# Patient Record
Sex: Female | Born: 1995 | Race: Black or African American | Hispanic: No | Marital: Single | Smoking: Never smoker
Health system: Southern US, Community
[De-identification: ages and names within clinical notes are randomized; demographics above are authoritative.]

## PROBLEM LIST (undated history)

## (undated) ENCOUNTER — Inpatient Hospital Stay (HOSPITAL_COMMUNITY): Payer: Self-pay

## (undated) DIAGNOSIS — Z789 Other specified health status: Secondary | ICD-10-CM

## (undated) HISTORY — PX: WISDOM TOOTH EXTRACTION: SHX21

---

## 2016-06-21 ENCOUNTER — Ambulatory Visit (HOSPITAL_COMMUNITY)
Admission: EM | Admit: 2016-06-21 | Discharge: 2016-06-21 | Disposition: A | Discharge status: Home / Self Care | Payer: Self-pay | Attending: Family Medicine | Admitting: Family Medicine

## 2016-06-21 ENCOUNTER — Encounter (HOSPITAL_COMMUNITY): Payer: Self-pay | Admitting: Family Medicine

## 2016-06-21 DIAGNOSIS — N762 Acute vulvitis: Secondary | ICD-10-CM | POA: Insufficient documentation

## 2016-06-21 LAB — POCT PREGNANCY, URINE: PREG TEST UR: NEGATIVE

## 2016-06-21 MED ORDER — FLUCONAZOLE 150 MG PO TABS: 150.0000 mg | ORAL_TABLET | Freq: Once | ORAL | 1 refills | Status: AC

## 2016-06-21 NOTE — ED Provider Notes (Signed)
[facility]    CSN: [id number] Arrival date & time: 06/21/16  1811     History   Chief Complaint Chief Complaint  Patient presents with  . Vaginitis    HPI Brittany Berg is a 21 y.o. female.   Pt here for vaginal swelling, redness and slight itching since Monday. She's had yeast infections before but they've all been associated with discharge.  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 No pertinent past medical history.  There are no active problems to display for this patient.   History reviewed. No pertinent surgical history.  OB History    No data available       Home Medications    Prior to Admission medications   Medication Sig Start Date End Date Taking? Authorizing Provider  fluconazole (DIFLUCAN) 150 MG tablet Take 1 tablet (150 mg total) by mouth once. Repeat if needed 06/21/16 06/21/16  Elvina SidleLauenstein, Cedar Ditullio, MD    Family History History reviewed. No pertinent family history.  Social History Social History  Substance Use Topics  . Smoking status: Never Smoker  . Smokeless tobacco: Never Used  . Alcohol use Not on file     Allergies   Patient has no known allergies.   Review of Systems Review of Systems  Genitourinary: Positive for vaginal pain.  All other systems reviewed and are negative.    Physical Exam Triage Vital Signs ED Triage Vitals [06/21/16 1852]  Enc Vitals Group     BP 99/61     Pulse Rate 81     Resp 18     Temp 98.4 F (36.9 C)     Temp Source Oral     SpO2 100 %     Weight      Height      Head Circumference      Peak Flow      Pain Score      Pain Loc      Pain Edu?      Excl. in GC?    No data found.   Updated Vital Signs BP 99/61   Pulse 81   Temp 98.4 F (36.9 C) (Oral)   Resp 18   LMP 05/10/2016   SpO2 100%    Physical Exam  Constitutional: She is oriented to person, place, and time. She appears well-developed and well-nourished.  HENT:  Right Ear:  External ear normal.  Left Ear: External ear normal.  Mouth/Throat: Oropharynx is clear and moist.  Eyes: Conjunctivae are normal. Pupils are equal, round, and reactive to light.  Neck: Normal range of motion. Neck supple.  Musculoskeletal: Normal range of motion.  Neurological: She is alert and oriented to person, place, and time.  Skin: Skin is dry.  Nursing note and vitals reviewed.    UC Treatments / Results  Labs (all labs ordered are listed, but only abnormal results are displayed) Labs Reviewed  POCT PREGNANCY, URINE  URINE CYTOLOGY ANCILLARY ONLY    EKG  EKG Interpretation None       Radiology No results found.  Procedures Procedures (including critical care time)  Medications Ordered in UC Medications - No data to display   Initial Impression / Assessment and Plan / UC Course  I have reviewed the triage vital signs and the nursing notes.  Pertinent labs & imaging results that were available during my care of the patient were reviewed by me and considered in my medical decision making (see chart  for details).     Final Clinical Impressions(s) / UC Diagnoses   Final diagnoses:  Acute vulvitis    New Prescriptions New Prescriptions   FLUCONAZOLE (DIFLUCAN) 150 MG TABLET    Take 1 tablet (150 mg total) by mouth once. Repeat if needed     Elvina Sidle, MD 06/21/16 [phone]

## 2016-06-21 NOTE — ED Triage Notes (Signed)
Pt here for vaginal swelling, redness and slight itching since Monday.

## 2016-06-22 LAB — POCT URINALYSIS DIP (DEVICE)
BILIRUBIN URINE: NEGATIVE
Glucose, UA: NEGATIVE mg/dL
Ketones, ur: NEGATIVE mg/dL
NITRITE: NEGATIVE
PH: 6 (ref 5.0–8.0)
Protein, ur: NEGATIVE mg/dL
UROBILINOGEN UA: 1 mg/dL (ref 0.0–1.0)

## 2016-06-22 LAB — URINE CYTOLOGY ANCILLARY ONLY
Chlamydia: NEGATIVE
Neisseria Gonorrhea: NEGATIVE
Trichomonas: NEGATIVE

## 2016-06-26 LAB — URINE CYTOLOGY ANCILLARY ONLY
Bacterial vaginitis: NEGATIVE
Candida vaginitis: POSITIVE — AB

## 2016-11-10 ENCOUNTER — Inpatient Hospital Stay (HOSPITAL_COMMUNITY)
Admission: AD | Admit: 2016-11-10 | Discharge: 2016-11-10 | Disposition: A | Discharge status: Home / Self Care | Payer: Medicaid Other | Source: Ambulatory Visit | Attending: Obstetrics & Gynecology | Admitting: Obstetrics & Gynecology

## 2016-11-10 ENCOUNTER — Inpatient Hospital Stay (HOSPITAL_COMMUNITY): Payer: Medicaid Other

## 2016-11-10 ENCOUNTER — Encounter (HOSPITAL_COMMUNITY): Payer: Self-pay | Admitting: *Deleted

## 2016-11-10 DIAGNOSIS — O209 Hemorrhage in early pregnancy, unspecified: Secondary | ICD-10-CM | POA: Insufficient documentation

## 2016-11-10 DIAGNOSIS — O26851 Spotting complicating pregnancy, first trimester: Secondary | ICD-10-CM | POA: Diagnosis present

## 2016-11-10 DIAGNOSIS — O210 Mild hyperemesis gravidarum: Secondary | ICD-10-CM | POA: Insufficient documentation

## 2016-11-10 DIAGNOSIS — Z3A01 Less than 8 weeks gestation of pregnancy: Secondary | ICD-10-CM | POA: Diagnosis not present

## 2016-11-10 DIAGNOSIS — Z3491 Encounter for supervision of normal pregnancy, unspecified, first trimester: Secondary | ICD-10-CM

## 2016-11-10 DIAGNOSIS — O219 Vomiting of pregnancy, unspecified: Secondary | ICD-10-CM

## 2016-11-10 HISTORY — DX: Other specified health status: Z78.9

## 2016-11-10 LAB — URINALYSIS, ROUTINE W REFLEX MICROSCOPIC
BILIRUBIN URINE: NEGATIVE
Glucose, UA: NEGATIVE mg/dL
KETONES UR: NEGATIVE mg/dL
Nitrite: NEGATIVE
PH: 6 (ref 5.0–8.0)
Protein, ur: NEGATIVE mg/dL
Specific Gravity, Urine: 1.028 (ref 1.005–1.030)

## 2016-11-10 LAB — CBC
HCT: 34.2 % — ABNORMAL LOW (ref 36.0–46.0)
Hemoglobin: 11.5 g/dL — ABNORMAL LOW (ref 12.0–15.0)
MCH: 28.9 pg (ref 26.0–34.0)
MCHC: 33.6 g/dL (ref 30.0–36.0)
MCV: 85.9 fL (ref 78.0–100.0)
PLATELETS: 225 10*3/uL (ref 150–400)
RBC: 3.98 MIL/uL (ref 3.87–5.11)
RDW: 14.1 % (ref 11.5–15.5)
WBC: 4.9 10*3/uL (ref 4.0–10.5)

## 2016-11-10 LAB — WET PREP, GENITAL
Clue Cells Wet Prep HPF POC: NONE SEEN
Sperm: NONE SEEN
TRICH WET PREP: NONE SEEN
Yeast Wet Prep HPF POC: NONE SEEN

## 2016-11-10 LAB — HCG, QUANTITATIVE, PREGNANCY: hCG, Beta Chain, Quant, S: 22600 m[IU]/mL — ABNORMAL HIGH (ref <5)

## 2016-11-10 LAB — ABO/RH: ABO/RH(D): O POS

## 2016-11-10 LAB — POCT PREGNANCY, URINE: Preg Test, Ur: POSITIVE — AB

## 2016-11-10 MED ORDER — METOCLOPRAMIDE HCL 10 MG PO TABS: 10.0000 mg | ORAL_TABLET | Freq: Three times a day (TID) | ORAL | 2 refills | Status: DC

## 2016-11-10 MED ORDER — METOCLOPRAMIDE HCL 10 MG PO TABS: 10.0000 mg | ORAL_TABLET | Freq: Once | ORAL | Status: DC

## 2016-11-10 MED ORDER — PROMETHAZINE HCL 25 MG PO TABS: ORAL_TABLET | ORAL | 2 refills | Status: DC

## 2016-11-10 NOTE — MAU Provider Note (Signed)
Chief Complaint: Vaginal Bleeding; Abdominal Pain; and Possible Pregnancy   None     SUBJECTIVE HPI: Brittany Berg is a 21 y.o. G1P0 at [redacted]w[redacted]d by LMP who presents to maternity admissions reporting spotting/light vaginal bleeding x 4 days. Patient's last menstrual period was 09/29/2016.  She is sure of her LMP dates.  She also reports nausea with vomiting 2-3 times daily x 1 week. She has not tried any treatments. There are no other associated symptoms.   She denies vaginal itching/burning, urinary symptoms, h/a, dizziness, or fever/chills.     HPI  Past Medical History:  Diagnosis Date  . Medical history non-contributory    Past Surgical History:  Procedure Laterality Date  . WISDOM TOOTH EXTRACTION     Social History   Social History  . Marital status: Single    Spouse name: N/A  . Number of children: N/A  . Years of education: N/A   Occupational History  . Not on file.   Social History Main Topics  . Smoking status: Never Smoker  . Smokeless tobacco: Never Used  . Alcohol use No  . Drug use: No  . Sexual activity: Yes   Other Topics Concern  . Not on file   Social History Narrative  . No narrative on file   No current facility-administered medications on file prior to encounter.    No current outpatient prescriptions on file prior to encounter.   No Known Allergies  ROS:  Review of Systems  Constitutional: Negative for chills, fatigue and fever.  Respiratory: Negative for shortness of breath.   Cardiovascular: Negative for chest pain.  Gastrointestinal: Positive for nausea and vomiting.  Genitourinary: Positive for vaginal bleeding. Negative for difficulty urinating, dysuria, flank pain, pelvic pain, vaginal discharge and vaginal pain.  Neurological: Negative for dizziness and headaches.  Psychiatric/Behavioral: Negative.      I have reviewed patient's Past Medical Hx, Surgical Hx, Family Hx, Social Hx, medications and allergies.   Physical Exam   Patient Vitals for the past 24 hrs:  BP Temp Temp src Pulse Resp Height Weight  11/10/16 1504 (!) 109/54 98.6 F (37 C) Oral 94 18 5' 6.93" (1.7 m) 148 lb 8 oz (67.4 kg)   Constitutional: Well-developed, well-nourished female in no acute distress.  Cardiovascular: normal rate Respiratory: normal effort GI: Abd soft, non-tender. Pos BS x 4 MS: Extremities nontender, no edema, normal ROM Neurologic: Alert and oriented x 4.  GU: Neg CVAT.  PELVIC EXAM: Cervix pink, visually closed, without lesion, scant brown discharge, vaginal walls and external genitalia normal Bimanual exam: Cervix 0/long/high, firm, anterior, neg CMT, uterus nontender, nonenlarged, adnexa without tenderness, enlargement, or mass   LAB RESULTS Results for orders placed or performed during the hospital encounter of 11/10/16 (from the past 24 hour(s))  Urinalysis, Routine w reflex microscopic     Status: Abnormal   Collection Time: 11/10/16  3:08 PM  Result Value Ref Range   Color, Urine YELLOW YELLOW   APPearance HAZY (A) CLEAR   Specific Gravity, Urine 1.028 1.005 - 1.030   pH 6.0 5.0 - 8.0   Glucose, UA NEGATIVE NEGATIVE mg/dL   Hgb urine dipstick LARGE (A) NEGATIVE   Bilirubin Urine NEGATIVE NEGATIVE   Ketones, ur NEGATIVE NEGATIVE mg/dL   Protein, ur NEGATIVE NEGATIVE mg/dL   Nitrite NEGATIVE NEGATIVE   Leukocytes, UA TRACE (A) NEGATIVE   RBC / HPF 0-5 0 - 5 RBC/hpf   WBC, UA 0-5 0 - 5 WBC/hpf   Bacteria, UA FEW (  A) NONE SEEN   Squamous Epithelial / LPF 6-30 (A) NONE SEEN   Mucus PRESENT   Pregnancy, urine POC     Status: Abnormal   Collection Time: 11/10/16  3:19 PM  Result Value Ref Range   Preg Test, Ur POSITIVE (A) NEGATIVE  CBC     Status: Abnormal   Collection Time: 11/10/16  5:12 PM  Result Value Ref Range   WBC 4.9 4.0 - 10.5 K/uL   RBC 3.98 3.87 - 5.11 MIL/uL   Hemoglobin 11.5 (L) 12.0 - 15.0 g/dL   HCT 69.6 (L) 29.5 - 28.4 %   MCV 85.9 78.0 - 100.0 fL   MCH 28.9 26.0 - 34.0 pg   MCHC  33.6 30.0 - 36.0 g/dL   RDW 13.2 44.0 - 10.2 %   Platelets 225 150 - 400 K/uL  hCG, quantitative, pregnancy     Status: Abnormal   Collection Time: 11/10/16  5:12 PM  Result Value Ref Range   hCG, Beta Chain, Quant, S 22,600 (H) <5 mIU/mL  ABO/Rh     Status: None (Preliminary result)   Collection Time: 11/10/16  5:12 PM  Result Value Ref Range   ABO/RH(D) O POS   Wet prep, genital     Status: Abnormal   Collection Time: 11/10/16  5:27 PM  Result Value Ref Range   Yeast Wet Prep HPF POC NONE SEEN NONE SEEN   Trich, Wet Prep NONE SEEN NONE SEEN   Clue Cells Wet Prep HPF POC NONE SEEN NONE SEEN   WBC, Wet Prep HPF POC MODERATE (A) NONE SEEN   Sperm NONE SEEN     --/--/O POS (10/05 1712)  IMAGING US Ob Comp Less 14 Wks  Result Date: 11/10/2016 CLINICAL DATA:  Initial evaluation for vaginal bleeding for 1 week, cramping. EXAM: OBSTETRIC <14 WK [place] AND TRANSVAGINAL OB US TECHNIQUE: Both transabdominal and transvaginal ultrasound examinations were performed for complete evaluation of the gestation as well as the maternal uterus, adnexal regions, and pelvic cul-de-sac. Transvaginal technique was performed to assess early pregnancy. COMPARISON:  None. FINDINGS: Intrauterine gestational sac: Single Yolk sac:  Present Embryo:  Not visualized. Cardiac Activity: N/A Heart Rate: N/A  bpm MSD: 14.8  mm   6 w   2  d Subchorionic hemorrhage:  None visualized. Maternal uterus/adnexae: Ovaries visualized bilaterally and are normal in appearance. No adnexal mass. No free fluid within the pelvis. IMPRESSION: 1. Probable early intrauterine gestational sac with internal yolk sac, but no fetal pole or cardiac activity yet visualized. Recommend follow-up quantitative B-HCG levels and follow-up US in 14 days to assess viability. 2. No other acute maternal uterine or adnexal abnormality identified. Electronically Signed   By: Rise Mu M.D.   On: 11/10/2016 18:29   US Ob Transvaginal  Result Date:  11/10/2016 CLINICAL DATA:  Initial evaluation for vaginal bleeding for 1 week, cramping. EXAM: OBSTETRIC <14 WK [place] AND TRANSVAGINAL OB US TECHNIQUE: Both transabdominal and transvaginal ultrasound examinations were performed for complete evaluation of the gestation as well as the maternal uterus, adnexal regions, and pelvic cul-de-sac. Transvaginal technique was performed to assess early pregnancy. COMPARISON:  None. FINDINGS: Intrauterine gestational sac: Single Yolk sac:  Present Embryo:  Not visualized. Cardiac Activity: N/A Heart Rate: N/A  bpm MSD: 14.8  mm   6 w   2  d Subchorionic hemorrhage:  None visualized. Maternal uterus/adnexae: Ovaries visualized bilaterally and are normal in appearance. No adnexal mass. No free fluid within the pelvis. IMPRESSION:  1. Probable early intrauterine gestational sac with internal yolk sac, but no fetal pole or cardiac activity yet visualized. Recommend follow-up quantitative B-HCG levels and follow-up US in 14 days to assess viability. 2. No other acute maternal uterine or adnexal abnormality identified. Electronically Signed   By: Rise Mu M.D.   On: 11/10/2016 18:29    MAU Management/MDM: Ordered labs and [place] and reviewed results.  IUP noted on today's [place].  Pt to f/u with prenatal care.  Bleeding precautions reviewed.  Rx for Reglan and Phenergan to take at home, Reglan in daytime/Phenergan Q HS.  Pt stable at time of discharge.  ASSESSMENT 1. Normal IUP (intrauterine pregnancy) on prenatal ultrasound, first trimester   2. Vaginal bleeding in pregnancy, first trimester   3. Nausea and vomiting during pregnancy prior to [redacted] weeks gestation     PLAN Discharge home with bleeding precautions  Allergies as of 11/10/2016   No Known Allergies     Medication List    TAKE these medications   metoCLOPramide 10 MG tablet Commonly known as:  REGLAN Take 1 tablet (10 mg total) by mouth 3 (three) times daily before meals.   promethazine 25 MG  tablet Commonly known as:  PHENERGAN Take at bedtime if taking Reglan during the day.      Follow-up Information    Prenatal provider of your choice Follow up.   Why:  See list provided, return to MAU with emergencies          Sharen Counter Certified Nurse-Midwife 11/10/2016  7:18 PM

## 2016-11-10 NOTE — MAU Note (Signed)
Been bleeding for the past wk. Cramping in her stomach, started today.  Earlier the week had some back pain. Has some bumps above her pubic hair, first noted last wk.

## 2016-11-10 NOTE — Discharge Instructions (Signed)
[facility]  ° ° °[facility]       Phone: [phone] ° °[facility] Phone: [phone] ° °[facility] [place]  Phone: [phone] ° °[facility] [place]  Phone: [phone] ° °[facility] [place]  Phone: [phone] ° °[facility]       Phone: [phone] ° °[facility]    Phone: [phone]  ° °[facility]    Phone: [phone] ° °[facility]    Phone: [phone] ° °[facility]    Phone: [phone] ° °[facility]    Phone: [phone] ° °[facility]       Phone: [phone]  ° °[facility]  Phone: [phone] ° °[facility]    Phone: [phone] ° °[facility]   Phone: [phone] ° °[facility]      Phone: [phone] ° °[facility]    Phone: [phone] ° °Safe Medications in Pregnancy  ° °Acne: °Benzoyl Peroxide °Salicylic Acid ° °Backache/Headache: °Tylenol: 2 regular strength every 4 hours OR °             2 Extra strength every 6 hours ° °Colds/Coughs/Allergies: °Benadryl (alcohol free) 25 mg every 6 hours as needed °Breath right strips °Claritin °Cepacol throat lozenges °Chloraseptic throat spray °Cold-Eeze- up to three times per day °Cough drops, alcohol free °Flonase (by prescription only) °Guaifenesin °Mucinex °Robitussin DM (plain only, alcohol free) °Saline nasal spray/drops °Sudafed (pseudoephedrine) & Actifed ** use only after [redacted] weeks gestation and if you do not have high blood pressure °Tylenol °Vicks Vaporub °Zinc lozenges °Zyrtec  ° °Constipation: °Colace °Ducolax suppositories °Fleet enema °Glycerin suppositories °Metamucil °Milk of  magnesia °Miralax °Senokot °Smooth move tea ° °Diarrhea: °Kaopectate °Imodium A-D ° °*NO pepto Bismol ° °Hemorrhoids: °Anusol °Anusol HC °Preparation H °Tucks ° °Indigestion: °Tums °Maalox °Mylanta °Zantac  °Pepcid ° °Insomnia: °Benadryl (alcohol free) 25mg every 6 hours as needed °Tylenol PM °Unisom, no Gelcaps ° °Leg Cramps: °Tums °MagGel ° °Nausea/Vomiting:  °Bonine °Dramamine °Emetrol °Ginger extract °Sea bands °Meclizine  °Nausea medication to take during pregnancy:  °Unisom (doxylamine succinate 25 mg tablets) Take one tablet daily at bedtime. If symptoms are not adequately controlled, the dose can be increased to a maximum recommended dose of two tablets daily (1/2 tablet in the morning, 1/2 tablet mid-afternoon and one at bedtime). °Vitamin B6 100mg tablets. Take one tablet twice a day (up to 200 mg per day). ° °Skin Rashes: °Aveeno products °Benadryl cream or 25mg every 6 hours as needed °Calamine Lotion °1% cortisone cream ° °Yeast infection: °Gyne-lotrimin 7 °Monistat 7 ° ° °**If taking multiple medications, please check labels to avoid duplicating the same active ingredients °**take medication as directed on the label °** Do not exceed 4000 mg of tylenol in 24 hours °**Do not take medications that contain aspirin or ibuprofen ° ° ° ° °

## 2016-11-11 LAB — HIV ANTIBODY (ROUTINE TESTING W REFLEX): HIV SCREEN 4TH GENERATION: NONREACTIVE

## 2016-11-13 LAB — GC/CHLAMYDIA PROBE AMP (~~LOC~~) NOT AT ARMC
Chlamydia: NEGATIVE
NEISSERIA GONORRHEA: NEGATIVE

## 2016-12-21 ENCOUNTER — Encounter: Payer: Self-pay | Admitting: Certified Nurse Midwife

## 2017-01-01 ENCOUNTER — Encounter: Payer: Self-pay | Admitting: Certified Nurse Midwife

## 2017-01-01 ENCOUNTER — Ambulatory Visit (INDEPENDENT_AMBULATORY_CARE_PROVIDER_SITE_OTHER): Payer: Medicaid Other | Admitting: Certified Nurse Midwife

## 2017-01-01 ENCOUNTER — Other Ambulatory Visit (HOSPITAL_COMMUNITY)
Admission: RE | Admit: 2017-01-01 | Discharge: 2017-01-01 | Disposition: A | Discharge status: Home / Self Care | Payer: Medicaid Other | Source: Ambulatory Visit | Attending: Certified Nurse Midwife | Admitting: Certified Nurse Midwife

## 2017-01-01 VITALS — BP 121/66 | HR 88 | Wt 198.4 lb

## 2017-01-01 DIAGNOSIS — N6459 Other signs and symptoms in breast: Secondary | ICD-10-CM

## 2017-01-01 DIAGNOSIS — Z3400 Encounter for supervision of normal first pregnancy, unspecified trimester: Secondary | ICD-10-CM | POA: Insufficient documentation

## 2017-01-01 DIAGNOSIS — Z3402 Encounter for supervision of normal first pregnancy, second trimester: Secondary | ICD-10-CM

## 2017-01-01 DIAGNOSIS — O219 Vomiting of pregnancy, unspecified: Secondary | ICD-10-CM

## 2017-01-01 DIAGNOSIS — O99210 Obesity complicating pregnancy, unspecified trimester: Secondary | ICD-10-CM | POA: Insufficient documentation

## 2017-01-01 MED ORDER — PRENATE PIXIE 10-0.6-0.4-200 MG PO CAPS: 1.0000 | ORAL_CAPSULE | Freq: Every day | ORAL | 12 refills | Status: DC

## 2017-01-01 MED ORDER — DOXYLAMINE-PYRIDOXINE 10-10 MG PO TBEC: DELAYED_RELEASE_TABLET | ORAL | 4 refills | Status: DC

## 2017-01-01 NOTE — Addendum Note (Signed)
Addended by: Marya LandryFOSTER, Nicanor Mendolia D on: 01/01/2017 04:31 PM   Modules accepted: Orders

## 2017-01-01 NOTE — Progress Notes (Signed)
Pt needs rx for PNV

## 2017-01-01 NOTE — Addendum Note (Signed)
Addended by: Marya LandryFOSTER, Rokhaya Quinn D on: 01/01/2017 04:11 PM   Modules accepted: Orders

## 2017-01-01 NOTE — Progress Notes (Signed)
Subjective:   Brittany Berg is a 21 y.o. G1P0 at 6059w3d by LMP, early ultrasound being seen today for her first obstetrical visit.  Her obstetrical history is significant for obesity. Patient does intend to breast feed. Pregnancy history fully reviewed.  Just graduated cosmetology school.  Some question as to who the FOB is.    Patient reports nausea and vomiting.  HISTORY: Obstetric History   G1   P0   T0   P0   A0   L0    SAB0   TAB0   Ectopic0   Multiple0   Live Births0     # Outcome Date GA Lbr Len/2nd Weight Sex Delivery Anes PTL Lv  1 Current              Past Medical History:  Diagnosis Date  . Medical history non-contributory    Past Surgical History:  Procedure Laterality Date  . WISDOM TOOTH EXTRACTION     No family history on file. Social History   Tobacco Use  . Smoking status: Never Smoker  . Smokeless tobacco: Never Used  Substance Use Topics  . Alcohol use: No  . Drug use: No   No Known Allergies Current Outpatient Medications on File Prior to Visit  Medication Sig Dispense Refill  . metoCLOPramide (REGLAN) 10 MG tablet Take 1 tablet (10 mg total) by mouth 3 (three) times daily before meals. 60 tablet 2  . promethazine (PHENERGAN) 25 MG tablet Take at bedtime if taking Reglan during the day. 30 tablet 2   No current facility-administered medications on file prior to visit.      Exam   Vitals:   01/01/17 1407  BP: 121/66  Pulse: 88  Weight: 198 lb 6.4 oz (90 kg)   Fetal Heart Rate (bpm): 143; doppler  Uterus:     Pelvic Exam: Perineum: no hemorrhoids, normal perineum   Vulva: normal external genitalia, no lesions   Vagina:  normal mucosa, normal discharge   Cervix: no lesions and normal, pap smear done.    Adnexa: normal adnexa and no mass, fullness, tenderness   Bony Pelvis: average  System: General: well-developed, well-nourished female in no acute distress   Breast:  normal appearance, no masses or tenderness   Skin: normal  coloration and turgor, no rashes   Neurologic: oriented, normal, negative, normal mood   Extremities: normal strength, tone, and muscle mass, ROM of all joints is normal   HEENT PERRLA, extraocular movement intact and sclera clear, anicteric   Mouth/Teeth mucous membranes moist, pharynx normal without lesions and dental hygiene good   Neck supple and no masses   Cardiovascular: regular rate and rhythm   Respiratory:  no respiratory distress, normal breath sounds   Abdomen: soft, non-tender; bowel sounds normal; no masses,  no organomegaly     Assessment:   Pregnancy: G1P0 Patient Active Problem List   Diagnosis Date Noted  . Encounter for supervision of normal first pregnancy in second trimester 01/01/2017     Plan:  1. Encounter for supervision of normal first pregnancy in second trimester    ?obesity BMI is 27 but initial ob visit weight is 198# - Hemoglobinopathy evaluation - Varicella zoster antibody, IgG - MaterniT21 PLUS Core+SCA - Obstetric Panel, Including HIV - Hemoglobin A1c - TSH Pregnancy - Inheritest Core(CF97,SMA,FraX) - Prenat-FeAsp-Meth-FA-DHA w/o A (PRENATE PIXIE) 10-0.6-0.4-200 MG CAPS; Take 1 tablet by mouth daily.  Dispense: 30 capsule; Refill: 12  2. Nausea and vomiting during pregnancy   -  Doxylamine-Pyridoxine (DICLEGIS) 10-10 MG TBEC; Take 1 tablet with breakfast and lunch.  Take 2 tablets at bedtime.  Dispense: 100 tablet; Refill: 4   Initial labs drawn. Continue prenatal vitamins. Genetic Screening discussed, NIPS: ordered. Ultrasound discussed; fetal anatomic survey: ordered. Problem list reviewed and updated. The nature [facility] with multiple MDs and other Advanced Practice Providers was explained to patient; also emphasized that residents, students are part of our team. Routine obstetric precautions reviewed. Return in about 4 weeks (around 01/29/2017) for ROB.     Orvilla Cornwallachelle Isebella Upshur, CNM [facility]

## 2017-01-02 LAB — OBSTETRIC PANEL, INCLUDING HIV
Antibody Screen: NEGATIVE
Basophils Absolute: 0 10*3/uL (ref 0.0–0.2)
Basos: 0 %
EOS (ABSOLUTE): 0.1 10*3/uL (ref 0.0–0.4)
EOS: 1 %
HIV Screen 4th Generation wRfx: NONREACTIVE
Hematocrit: 34.6 % (ref 34.0–46.6)
Hemoglobin: 11.2 g/dL (ref 11.1–15.9)
Hepatitis B Surface Ag: NEGATIVE
IMMATURE GRANS (ABS): 0 10*3/uL (ref 0.0–0.1)
IMMATURE GRANULOCYTES: 0 %
LYMPHS: 21 %
Lymphocytes Absolute: 1.4 10*3/uL (ref 0.7–3.1)
MCH: 28.9 pg (ref 26.6–33.0)
MCHC: 32.4 g/dL (ref 31.5–35.7)
MCV: 89 fL (ref 79–97)
MONOCYTES: 8 %
Monocytes Absolute: 0.5 10*3/uL (ref 0.1–0.9)
Neutrophils Absolute: 4.6 10*3/uL (ref 1.4–7.0)
Neutrophils: 70 %
PLATELETS: 238 10*3/uL (ref 150–379)
RBC: 3.87 x10E6/uL (ref 3.77–5.28)
RDW: 14 % (ref 12.3–15.4)
RPR Ser Ql: NONREACTIVE
RUBELLA: 4.39 {index} (ref >0.99)
Rh Factor: POSITIVE
WBC: 6.6 10*3/uL (ref 3.4–10.8)

## 2017-01-02 LAB — CERVICOVAGINAL ANCILLARY ONLY
Bacterial vaginitis: NEGATIVE
CHLAMYDIA, DNA PROBE: NEGATIVE
Candida vaginitis: NEGATIVE
NEISSERIA GONORRHEA: NEGATIVE
TRICH (WINDOWPATH): NEGATIVE

## 2017-01-02 LAB — HEMOGLOBINOPATHY EVALUATION
HEMOGLOBIN A2 QUANTITATION: 2.2 % (ref 1.8–3.2)
HEMOGLOBIN F QUANTITATION: 0.8 % (ref 0.0–2.0)
HGB C: 0 %
HGB S: 0 %
HGB VARIANT: 0 %
Hgb A: 97 % (ref 96.4–98.8)

## 2017-01-02 LAB — HEMOGLOBIN A1C
Est. average glucose Bld gHb Est-mCnc: 97 mg/dL
HEMOGLOBIN A1C: 5 % (ref 4.8–5.6)

## 2017-01-02 LAB — TSH PREGNANCY: TSH PREGNANCY: 0.645 u[IU]/mL (ref 0.450–4.500)

## 2017-01-02 LAB — VARICELLA ZOSTER ANTIBODY, IGG

## 2017-01-03 LAB — URINE CULTURE, OB REFLEX: ORGANISM ID, BACTERIA: NO GROWTH

## 2017-01-03 LAB — CULTURE, OB URINE

## 2017-01-04 ENCOUNTER — Telehealth: Payer: Self-pay

## 2017-01-04 ENCOUNTER — Other Ambulatory Visit: Payer: Self-pay | Admitting: Certified Nurse Midwife

## 2017-01-04 DIAGNOSIS — O09899 Supervision of other high risk pregnancies, unspecified trimester: Secondary | ICD-10-CM

## 2017-01-04 DIAGNOSIS — Z3402 Encounter for supervision of normal first pregnancy, second trimester: Secondary | ICD-10-CM

## 2017-01-04 DIAGNOSIS — Z283 Underimmunization status: Principal | ICD-10-CM

## 2017-01-04 NOTE — Telephone Encounter (Signed)
-----   Message from Roe Coombsachelle A Denney, CNM sent at 01/04/2017  1:54 PM EST ----- Please also let her know that she is not immune to chicken pox and will need the vaciene after this pregnancy is completed, live vaciene cannot be given in pregnancy.  Thank you.  R.Denney CNM

## 2017-01-04 NOTE — Telephone Encounter (Signed)
Patient notified of results.

## 2017-01-05 ENCOUNTER — Encounter: Payer: Self-pay | Admitting: Certified Nurse Midwife

## 2017-01-06 LAB — MATERNIT21 PLUS CORE+SCA
Chromosome 13: NEGATIVE
Chromosome 18: NEGATIVE
Chromosome 21: NEGATIVE
Y Chromosome: NOT DETECTED

## 2017-01-07 ENCOUNTER — Encounter: Payer: Self-pay | Admitting: Obstetrics & Gynecology

## 2017-01-09 ENCOUNTER — Other Ambulatory Visit: Payer: Self-pay | Admitting: Certified Nurse Midwife

## 2017-01-09 ENCOUNTER — Encounter: Payer: Medicaid Other | Admitting: Certified Nurse Midwife

## 2017-01-09 DIAGNOSIS — Z3402 Encounter for supervision of normal first pregnancy, second trimester: Secondary | ICD-10-CM

## 2017-01-10 ENCOUNTER — Other Ambulatory Visit: Payer: Self-pay | Admitting: Certified Nurse Midwife

## 2017-01-10 LAB — INHERITEST CORE(CF97,SMA,FRAX)

## 2017-01-12 ENCOUNTER — Other Ambulatory Visit: Payer: Self-pay | Admitting: Certified Nurse Midwife

## 2017-01-12 DIAGNOSIS — Z3402 Encounter for supervision of normal first pregnancy, second trimester: Secondary | ICD-10-CM

## 2017-01-16 ENCOUNTER — Encounter: Payer: Self-pay | Admitting: Certified Nurse Midwife

## 2017-01-17 ENCOUNTER — Encounter: Payer: Self-pay | Admitting: Obstetrics

## 2017-01-31 ENCOUNTER — Encounter: Payer: Medicaid Other | Admitting: Certified Nurse Midwife

## 2017-02-12 ENCOUNTER — Ambulatory Visit (INDEPENDENT_AMBULATORY_CARE_PROVIDER_SITE_OTHER): Payer: Medicaid Other | Admitting: Certified Nurse Midwife

## 2017-02-12 VITALS — BP 118/66 | HR 74 | Wt 206.6 lb

## 2017-02-12 DIAGNOSIS — Z2839 Other underimmunization status: Secondary | ICD-10-CM

## 2017-02-12 DIAGNOSIS — Z3402 Encounter for supervision of normal first pregnancy, second trimester: Secondary | ICD-10-CM

## 2017-02-12 DIAGNOSIS — O99210 Obesity complicating pregnancy, unspecified trimester: Secondary | ICD-10-CM

## 2017-02-12 DIAGNOSIS — Z283 Underimmunization status: Secondary | ICD-10-CM

## 2017-02-12 DIAGNOSIS — O09899 Supervision of other high risk pregnancies, unspecified trimester: Secondary | ICD-10-CM

## 2017-02-12 NOTE — Progress Notes (Signed)
Patient reports feeling fetal flutter movements, denies pain. 

## 2017-02-12 NOTE — Progress Notes (Signed)
   PRENATAL VISIT NOTE  Subjective:  Brittany Berg is a 22 y.o. G1P0 at 5831w3d being seen today for ongoing prenatal care.  She is currently monitored for the following issues for this low-risk pregnancy and has Encounter for supervision of normal first pregnancy in second trimester; Obesity in pregnancy, antepartum; and Maternal varicella, non-immune on their problem list.  Patient reports no complaints.  Contractions: Not present. Vag. Bleeding: None.  Movement: Present. Denies leaking of fluid.   The following portions of the patient's history were reviewed and updated as appropriate: allergies, current medications, past family history, past medical history, past social history, past surgical history and problem list. Problem list updated.  Objective:   Vitals:   02/12/17 1057  BP: 118/66  Pulse: 74  Weight: 206 lb 9.6 oz (93.7 kg)    Fetal Status: Fetal Heart Rate (bpm): 147; doppler Fundal Height: 19 cm Movement: Present     General:  Alert, oriented and cooperative. Patient is in no acute distress.  Skin: Skin is warm and dry. No rash noted.   Cardiovascular: Normal heart rate noted  Respiratory: Normal respiratory effort, no problems with respiration noted  Abdomen: Soft, gravid, appropriate for gestational age.  Pain/Pressure: Absent     Pelvic: Cervical exam deferred        Extremities: Normal range of motion.  Edema: None  Mental Status:  Normal mood and affect. Normal behavior. Normal judgment and thought content.   Assessment and Plan:  Pregnancy: G1P0 at 7131w3d  1. Encounter for supervision of normal first pregnancy in second trimester     Doing well.  Discussed insurance.   - US MFM OB COMP + 14 WK; Future - AFP, Serum, Open Spina Bifida  2. Maternal varicella, non-immune     Varicella postpartum  3. Obesity in pregnancy, antepartum     Has gained 29lbs this pregnancy.   Preterm labor symptoms and general obstetric precautions including but not limited to  vaginal bleeding, contractions, leaking of fluid and fetal movement were reviewed in detail with the patient. Please refer to After Visit Summary for other counseling recommendations.  Return in about 4 weeks (around 03/12/2017) for ROB.   Roe Coombsachelle A Alfio Loescher, CNM

## 2017-02-12 NOTE — Patient Instructions (Addendum)
Preterm Labor and Birth Information Pregnancy normally lasts 39-41 weeks. Preterm labor is when labor starts early. It starts before you have been pregnant for 37 whole weeks. What are the risk factors for preterm labor? Preterm labor is more likely to occur in women who:  Have an infection while pregnant.  Have a cervix that is short.  Have gone into preterm labor before.  Have had surgery on their cervix.  Are younger than age 21.  Are older than age 62.  Are African American.  Are pregnant with two or more babies.  Take street drugs while pregnant.  Smoke while pregnant.  Do not gain enough weight while pregnant.  Got pregnant right after another pregnancy.  What are the symptoms of preterm labor? Symptoms of preterm labor include:  Cramps. The cramps may feel like the cramps some women get during their period. The cramps may happen with watery poop (diarrhea).  Pain in the belly (abdomen).  Pain in the lower back.  Regular contractions or tightening. It may feel like your belly is getting tighter.  Pressure in the lower belly that seems to get stronger.  More fluid (discharge) leaking from the vagina. The fluid may be watery or bloody.  Water breaking.  Why is it important to notice signs of preterm labor? Babies who are born early may not be fully developed. They have a higher chance for:  Long-term heart problems.  Long-term lung problems.  Trouble controlling body systems, like breathing.  Bleeding in the brain.  A condition called cerebral palsy.  Learning difficulties.  Death.  These risks are highest for babies who are born before 34 weeks of pregnancy. How is preterm labor treated? Treatment depends on:  How long you were pregnant.  Your condition.  The health of your baby.  Treatment may involve:  Having a stitch (suture) placed in your cervix. When you give birth, your cervix opens so the baby can come out. The stitch keeps the  cervix from opening too soon.  Staying at the hospital.  Taking or getting medicines, such as: ? Hormone medicines. ? Medicines to stop contractions. ? Medicines to help the baby's lungs develop. ? Medicines to prevent your baby from having cerebral palsy.  What should I do if I am in preterm labor? If you think you are going into labor too soon, call your doctor right away. How can I prevent preterm labor?  Do not use any tobacco products. ? Examples of these are cigarettes, chewing tobacco, and e-cigarettes. ? If you need help quitting, ask your doctor.  Do not use street drugs.  Do not use any medicines unless you ask your doctor if they are safe for you.  Talk with your doctor before taking any herbal supplements.  Make sure you gain enough weight.  Watch for infection. If you think you might have an infection, get it checked right away.  If you have gone into preterm labor before, tell your doctor. This information is not intended to replace advice given to you by your health care provider. Make sure you discuss any questions you have with your health care provider. Document Released: 04/21/2008 Document Revised: 07/06/2015 Document Reviewed: 06/16/2015 Elsevier Interactive Patient Education  2018 [employer].  Pregnancy, The Father's Role A father has an important role during his partner's pregnancy, labor, delivery, and after the birth of the baby. It is important to help and support your partner through this new period. There are many physical and emotional changes  that happen. To be helpful and supportive during this time, you should know and understand what is happening to your partner during pregnancy, labor, delivery, and after the baby is born. What are the stages of pregnancy? Pregnancy usually lasts about 40 weeks. The pregnancy is divided into three trimesters. First Trimester During the first 13 weeks, your partner may:  Feel tired.  Have painful  breasts.  Feel nauseous or throw up.  Urinate more often.  Have mood changes.  All of these changes are normal. If they are happening, try to be helpful, supportive, and understanding. This may include helping with household duties and activities and spending more time with each other. Second Trimester During the next 14-28 weeks:  Your partner will likely feel better and more energetic.  This is the best time of the pregnancy to be more active together.  You will be able to see her belly showing the pregnancy.  You may be able to feel the baby kick.  Your partner may have soreness or aching in her back as she gains weight. You can help her by carrying heavy things and by rubbing her back when she is feeling sore.  Third Trimester During the final 12 weeks, your partner may:  Become more uncomfortable as the baby grows.  Have a hard time doing everyday activities, and her balance may be off.  Have a hard time bending over.  Tire easily.  Have difficulty sleeping.  At this time, the birth of your baby is close. You and your partner may have concerns or questions. This is normal. Talk with each other and with your health care provider. Continue to help your partner with housework, encourage her to rest, and rub her sore back and legs, if this helps her. What can I expect or do during the pregnancy? You can expect to experience some changes. There are also many things you can do to help prepare you and your partner for your baby. Emotional Changes During your partner's pregnancy, emotional changes for you may include:  Having feelings of happiness, excitement, and pride.  Being concerned about having new responsibilities, such as financial or educational responsibilities.  Feeling overwhelmed or scared.  Being worried that a baby will change your relationship with your partner.  These feelings are normal. Talk about them openly with your partner and your health care  provider. Prenatal Care Attend prenatal care visits with your partner. This is a good time for you to get to know your health care provider, follow the pregnancy, and ask questions.  Prenatal visits usually occur one time each month for six months, then every two weeks for two months, and then one time each week during the last month. You may have more prenatal visits if your health care provider believes this is needed.  Your health care provider usually does an ultrasound of the baby at one of the prenatal visits. This may happen more often if your health care provider thinks it is needed.  Sexual Activity Sexual intercourse is safe unless there is a problem with the pregnancy and your health care provider advises you to not have sexual intercourse. Because physical and emotional changes happen in pregnancy, your partner may not want to have sex during certain times. Trying different positions may make sexual intercourse more comfortable. However, always respect your partner's decision if she does not want to have sex. It is important for both of you to discuss your feelings and desires. Talk with your health care  provider about any questions that you may have about sexual intercourse during pregnancy. Childbirth Classes Attend childbirth classes with your partner if you are able. Classes prepare you and help you to understand what happens during labor and delivery, and they help you and your partner to bond. There are even some classes that are only for new fathers. Classes also teach you and your partner:  Various relaxation techniques.  How to work with her labor pains.  How to focus during labor and delivery.  What should I know about labor and delivery? Many fathers want to be present while their partner is going through labor and delivery. You may:  Be asked to time the contractions, massage your partner's back, and breathe with her during the contractions.  Get to see and enjoy the  excitement of your baby being born, and you may even be able to cut your baby's umbilical cord. If you feel like you might faint or you are uncomfortable, ask someone to help you.  Need to leave the room if a problem develops during labor or delivery.  A cesarean delivery, or C-section, is a procedure that may be used to deliver the baby. It is done through an incision in the abdomen and the uterus. A cesarean delivery may be scheduled or it may be an emergency procedure during labor and delivery. Most hospitals allow the father to be in the room for a cesarean delivery unless it is an emergency. Recovery from a cesarean delivery usually requires more help from the father. What happens after delivery? After your baby is born, your partner will go through many changes again. These changes could last a few months or longer. Postpartum Depression Your partner may take awhile to regain her strength. She may also have feelings of sadness (postpartum blues or postpartum depression). If your partner is acting unusually sad or depressed, talk with your health care provider right away. This can be a serious medical condition that requires treatment. Breastfeeding Your partner may decide to breastfeed the baby. This helps with bonding between the mother and the baby, and breast milk is the best nutrition for your baby. You can feel included by burping the baby and bottle-feeding the baby with breast milk that was collected from the mother. This allows your partner to rest and helps you to bond with your baby. Sexual Activity It may take a few months for your partner's body to heal and be ready for sexual intercourse again. This may take longer after a cesarean delivery. If you have any questions about having sexual intercourse or if it is painful for your partner, talk with your health care provider. It is possible for breastfeeding mothers to become pregnant even if they are not having menstrual periods. Use  birth control (contraception) unless you and your partner would like to become pregnant again. What should I remember? Fatherhood and having a baby is an ongoing learning experience. It is common to be anxious, concerned, or afraid that you may not be taking care of your newborn baby properly. It is important to talk with your partner and your health care provider if you are worried or have any questions. This information is not intended to replace advice given to you by your health care provider. Make sure you discuss any questions you have with your health care provider. Document Released: 07/12/2007 Document Revised: 06/28/2015 Document Reviewed: 10/10/2013 Elsevier Interactive Patient Education  2017 [employer].  Second Trimester of Pregnancy The second trimester is from  week 13 through week 28, month 4 through 6. This is often the time in pregnancy that you feel your best. Often times, morning sickness has lessened or quit. You may have more energy, and you may get hungry more often. Your unborn baby (fetus) is growing rapidly. At the end of the sixth month, he or she is about 9 inches long and weighs about 1 pounds. You will likely feel the baby move (quickening) between 18 and 20 weeks of pregnancy. Follow these instructions at home:  Avoid all smoking, herbs, and alcohol. Avoid drugs not approved by your doctor.  Do not use any tobacco products, including cigarettes, chewing tobacco, and electronic cigarettes. If you need help quitting, ask your doctor. You may get counseling or other support to help you quit.  Only take medicine as told by your doctor. Some medicines are safe and some are not during pregnancy.  Exercise only as told by your doctor. Stop exercising if you start having cramps.  Eat regular, healthy meals.  Wear a good support bra if your breasts are tender.  Do not use hot tubs, steam rooms, or saunas.  Wear your seat belt when driving.  Avoid raw meat, uncooked  cheese, and liter boxes and soil used by cats.  Take your prenatal vitamins.  Take 1500-2000 milligrams of calcium daily starting at the 20th week of pregnancy until you deliver your baby.  Try taking medicine that helps you poop (stool softener) as needed, and if your doctor approves. Eat more fiber by eating fresh fruit, vegetables, and whole grains. Drink enough fluids to keep your pee (urine) clear or pale yellow.  Take warm water baths (sitz baths) to soothe pain or discomfort caused by hemorrhoids. Use hemorrhoid cream if your doctor approves.  If you have puffy, bulging veins (varicose veins), wear support hose. Raise (elevate) your feet for 15 minutes, 3-4 times a day. Limit salt in your diet.  Avoid heavy lifting, wear low heals, and sit up straight.  Rest with your legs raised if you have leg cramps or low back pain.  Visit your dentist if you have not gone during your pregnancy. Use a soft toothbrush to brush your teeth. Be gentle when you floss.  You can have sex (intercourse) unless your doctor tells you not to.  Go to your doctor visits. Get help if:  You feel dizzy.  You have mild cramps or pressure in your lower belly (abdomen).  You have a nagging pain in your belly area.  You continue to feel sick to your stomach (nauseous), throw up (vomit), or have watery poop (diarrhea).  You have bad smelling fluid coming from your vagina.  You have pain with peeing (urination). Get help right away if:  You have a fever.  You are leaking fluid from your vagina.  You have spotting or bleeding from your vagina.  You have severe belly cramping or pain.  You lose or gain weight rapidly.  You have trouble catching your breath and have chest pain.  You notice sudden or extreme puffiness (swelling) of your face, hands, ankles, feet, or legs.  You have not felt the baby move in over an hour.  You have severe headaches that do not go away with medicine.  You have  vision changes. This information is not intended to replace advice given to you by your health care provider. Make sure you discuss any questions you have with your health care provider. Document Released: 04/19/2009 Document Revised: 07/01/2015 Document  Reviewed: 03/26/2012 Elsevier Interactive Patient Education  2017 [employer].

## 2017-02-14 ENCOUNTER — Encounter (HOSPITAL_COMMUNITY): Payer: Self-pay | Admitting: Certified Nurse Midwife

## 2017-02-19 ENCOUNTER — Other Ambulatory Visit: Payer: Self-pay | Admitting: Certified Nurse Midwife

## 2017-02-19 ENCOUNTER — Ambulatory Visit (HOSPITAL_COMMUNITY)
Admission: RE | Admit: 2017-02-19 | Discharge: 2017-02-19 | Disposition: A | Discharge status: Home / Self Care | Payer: Medicaid Other | Source: Ambulatory Visit | Attending: Certified Nurse Midwife | Admitting: Certified Nurse Midwife

## 2017-02-19 DIAGNOSIS — Z3A20 20 weeks gestation of pregnancy: Secondary | ICD-10-CM | POA: Insufficient documentation

## 2017-02-19 DIAGNOSIS — Z3402 Encounter for supervision of normal first pregnancy, second trimester: Secondary | ICD-10-CM

## 2017-02-19 DIAGNOSIS — O99212 Obesity complicating pregnancy, second trimester: Secondary | ICD-10-CM | POA: Insufficient documentation

## 2017-02-19 DIAGNOSIS — Z3689 Encounter for other specified antenatal screening: Secondary | ICD-10-CM | POA: Insufficient documentation

## 2017-02-20 ENCOUNTER — Other Ambulatory Visit: Payer: Self-pay | Admitting: Certified Nurse Midwife

## 2017-02-20 DIAGNOSIS — Z3402 Encounter for supervision of normal first pregnancy, second trimester: Secondary | ICD-10-CM

## 2017-02-22 LAB — AFP, SERUM, OPEN SPINA BIFIDA
AFP MoM: 1.06
AFP Value: 48.7 ng/mL
GEST. AGE ON COLLECTION DATE: 19.4 wk
MATERNAL AGE AT EDD: 20.4 a
OSBR RISK 1 IN: 10000
TEST RESULTS AFP: NEGATIVE
Weight: 207 [lb_av]

## 2017-02-27 ENCOUNTER — Other Ambulatory Visit: Payer: Self-pay | Admitting: Certified Nurse Midwife

## 2017-02-27 DIAGNOSIS — Z3402 Encounter for supervision of normal first pregnancy, second trimester: Secondary | ICD-10-CM

## 2017-03-08 ENCOUNTER — Inpatient Hospital Stay (HOSPITAL_COMMUNITY)
Admission: AD | Admit: 2017-03-08 | Discharge: 2017-03-08 | Disposition: A | Discharge status: Home / Self Care | Payer: Medicaid Other | Source: Ambulatory Visit | Attending: Obstetrics and Gynecology | Admitting: Obstetrics and Gynecology

## 2017-03-08 ENCOUNTER — Encounter (HOSPITAL_COMMUNITY): Payer: Self-pay | Admitting: *Deleted

## 2017-03-08 DIAGNOSIS — Z3A22 22 weeks gestation of pregnancy: Secondary | ICD-10-CM | POA: Insufficient documentation

## 2017-03-08 DIAGNOSIS — R350 Frequency of micturition: Secondary | ICD-10-CM | POA: Diagnosis not present

## 2017-03-08 DIAGNOSIS — O26892 Other specified pregnancy related conditions, second trimester: Secondary | ICD-10-CM | POA: Diagnosis not present

## 2017-03-08 DIAGNOSIS — R300 Dysuria: Secondary | ICD-10-CM

## 2017-03-08 LAB — URINALYSIS, ROUTINE W REFLEX MICROSCOPIC
BILIRUBIN URINE: NEGATIVE
Glucose, UA: NEGATIVE mg/dL
Hgb urine dipstick: NEGATIVE
Ketones, ur: NEGATIVE mg/dL
Nitrite: NEGATIVE
Protein, ur: NEGATIVE mg/dL
Specific Gravity, Urine: 1.02 (ref 1.005–1.030)
pH: 6 (ref 5.0–8.0)

## 2017-03-08 NOTE — MAU Note (Signed)
Pt presents with c/o UTI symptoms.  Reports urinary frequency, urgency, and dysuria/burning @ times.  Denies hematuria.  States has history of frequent UTI's.  Denies VB or LOF.  Reports +FM.

## 2017-03-08 NOTE — MAU Provider Note (Signed)
Chief Complaint: Dysuria   First Provider Initiated Contact with Patient 03/08/17 1042     SUBJECTIVE HPI: Brittany Berg is a 22 y.o. G1P0 at [id number] by LMP who presents to maternity admissions reporting increase urinary frequency and dysuria. She reports frequency started this past week and is intermittent, she reports frequency gets worse at night when she is laying down and she is "great during the day while at work" she does not have any urinary symptoms. She reports recent IC 3 days ago where she noticed burning when she urinated when she woke up after IC. Denies dysuria currently. She denies vaginal discharge, itching, burning or odor- declines vaginal swabs for STD screening. She denies vaginal bleeding,  h/a, dizziness, n/v, or fever/chills. +FM.  She denies abdominal pain or cramping.   Past Medical History:  Diagnosis Date  . Medical history non-contributory    Past Surgical History:  Procedure Laterality Date  . WISDOM TOOTH EXTRACTION     Social History   Socioeconomic History  . Marital status: Single    Spouse name: Not on file  . Number of children: Not on file  . Years of education: Not on file  . Highest education level: Not on file  Social Needs  . Financial resource strain: Not on file  . Food insecurity - worry: Not on file  . Food insecurity - inability: Not on file  . Transportation needs - medical: Not on file  . Transportation needs - non-medical: Not on file  Occupational History  . Not on file  Tobacco Use  . Smoking status: Never Smoker  . Smokeless tobacco: Never Used  Substance and Sexual Activity  . Alcohol use: No  . Drug use: No  . Sexual activity: Yes  Other Topics Concern  . Not on file  Social History Narrative  . Not on file   No current facility-administered medications on file prior to encounter.    Current Outpatient Medications on File Prior to Encounter  Medication Sig Dispense Refill  . Doxylamine-Pyridoxine (DICLEGIS) 10-10 MG  TBEC Take 1 tablet with breakfast and lunch.  Take 2 tablets at bedtime. (Patient not taking: Reported on 02/12/2017) 100 tablet 4  . metoCLOPramide (REGLAN) 10 MG tablet Take 1 tablet (10 mg total) by mouth 3 (three) times daily before meals. (Patient not taking: Reported on 02/12/2017) 60 tablet 2  . Prenat-FeAsp-Meth-FA-DHA w/o A (PRENATE PIXIE) 10-0.6-0.4-200 MG CAPS Take 1 tablet by mouth daily. 30 capsule 12  . promethazine (PHENERGAN) 25 MG tablet Take at bedtime if taking Reglan during the day. (Patient not taking: Reported on 02/12/2017) 30 tablet 2   No Known Allergies  ROS:  Review of Systems  Constitutional: Negative.   Respiratory: Negative.   Cardiovascular: Negative.   Gastrointestinal: Negative.   Genitourinary: Positive for dysuria and frequency. Negative for difficulty urinating, hematuria, pelvic pain, urgency, vaginal bleeding, vaginal discharge and vaginal pain.  Musculoskeletal: Negative.    I have reviewed patient's Past Medical Hx, Surgical Hx, Family Hx, Social Hx, medications and allergies.   Physical Exam   Patient Vitals for the past 24 hrs:  BP Temp Temp src Pulse Resp SpO2 Height Weight  03/08/17 1118 136/60 - - 84 - - - -  03/08/17 1015 119/67 98.3 F (36.8 C) Oral 85 16 97 % 5\' 6"  (1.676 m) 214 lb (97.1 kg)   Constitutional: Well-developed, well-nourished female in no acute distress.  Cardiovascular: normal rate Respiratory: normal effort GI: Abd soft, non-tender. Pos BS x 4  Neurologic: Alert and oriented x 4.  GU: Neg CVAT. PELVIC EXAM: deferred   FHT 140 by doppler   LAB RESULTS Results for orders placed or performed during the hospital encounter of 03/08/17 (from the past 24 hour(s))  Urinalysis, Routine w reflex microscopic     Status: Abnormal   Collection Time: 03/08/17 10:08 AM  Result Value Ref Range   Color, Urine YELLOW YELLOW   APPearance HAZY (A) CLEAR   Specific Gravity, Urine 1.020 1.005 - 1.030   pH 6.0 5.0 - 8.0   Glucose, UA  NEGATIVE NEGATIVE mg/dL   Hgb urine dipstick NEGATIVE NEGATIVE   Bilirubin Urine NEGATIVE NEGATIVE   Ketones, ur NEGATIVE NEGATIVE mg/dL   Protein, ur NEGATIVE NEGATIVE mg/dL   Nitrite NEGATIVE NEGATIVE   Leukocytes, UA LARGE (A) NEGATIVE   RBC / HPF 0-5 0 - 5 RBC/hpf   WBC, UA TOO NUMEROUS TO COUNT 0 - 5 WBC/hpf   Bacteria, UA MANY (A) NONE SEEN   Squamous Epithelial / LPF 6-30 (A) NONE SEEN   Mucus PRESENT     O/Positive/-- (11/26 1633)  MAU Management/MDM: Orders Placed This Encounter  Procedures  . Culture, OB Urine  . Urinalysis, Routine w reflex microscopic  Urine Culture- pending   Discussed s/s of UTI or pyelo in pregnancy. Discussed prevention of UTI with timed urination at least every 3 hours. Educated on position of baby at 43 week- discussed fetus being at the station of the bladder may increase urge to urinate in different positions or with movement of fetus. Pt agrees that when she feels baby move or kick she gets more urge to urinate. Pt discharged. Pt stable at time of discharge.   ASSESSMENT 1. Increased urinary frequency   2. Dysuria during pregnancy in second trimester     PLAN Discharge home Will call patient with results of Urine Culture if positive  Follow up as scheduled in the office for prenatal appointments  Return to MAU as needed for worsening symptoms    Allergies as of 03/08/2017   No Known Allergies     Medication List    STOP taking these medications   Doxylamine-Pyridoxine 10-10 MG Tbec Commonly known as:  DICLEGIS     TAKE these medications   metoCLOPramide 10 MG tablet Commonly known as:  REGLAN Take 1 tablet (10 mg total) by mouth 3 (three) times daily before meals.   PRENATE PIXIE 10-0.6-0.4-200 MG Caps Take 1 tablet by mouth daily.   promethazine 25 MG tablet Commonly known as:  PHENERGAN Take at bedtime if taking Reglan during the day.      Steward Drone  Certified Nurse-Midwife 03/08/2017  11:29 AM

## 2017-03-08 NOTE — MAU Note (Signed)
Pt reports she feels like has to pee all the time, occasional dysuria.

## 2017-03-09 LAB — CULTURE, OB URINE: Culture: 30000 — AB

## 2017-03-13 ENCOUNTER — Ambulatory Visit (INDEPENDENT_AMBULATORY_CARE_PROVIDER_SITE_OTHER): Payer: Medicaid Other | Admitting: Certified Nurse Midwife

## 2017-03-13 ENCOUNTER — Encounter: Payer: Self-pay | Admitting: Certified Nurse Midwife

## 2017-03-13 VITALS — BP 121/58 | HR 77 | Wt 216.0 lb

## 2017-03-13 DIAGNOSIS — O09899 Supervision of other high risk pregnancies, unspecified trimester: Secondary | ICD-10-CM

## 2017-03-13 DIAGNOSIS — Z283 Underimmunization status: Secondary | ICD-10-CM

## 2017-03-13 DIAGNOSIS — Z3402 Encounter for supervision of normal first pregnancy, second trimester: Secondary | ICD-10-CM

## 2017-03-13 MED ORDER — PRENATE PIXIE 10-0.6-0.4-200 MG PO CAPS: 1.0000 | ORAL_CAPSULE | Freq: Every day | ORAL | 12 refills | Status: AC

## 2017-03-13 NOTE — Patient Instructions (Addendum)
Glucose Tolerance Test During Pregnancy The glucose tolerance test (GTT) is a blood test used to determine if you have developed a type of diabetes during pregnancy (gestational diabetes). This is when your body does not properly process sugar (glucose) in the food you eat, resulting in high blood glucose levels. Typically, a GTT is done after you have had a 1-hour glucose test with results that indicate you possibly have gestational diabetes. It may also be done if:  You have a history of giving birth to very large babies or have experienced repeated fetal loss (stillbirth).  You have signs and symptoms of diabetes, such as: ? Changes in your vision. ? Tingling or numbness in your hands or feet. ? Changes in hunger, thirst, and urination not otherwise explained by your pregnancy.  The GTT lasts about 3 hours. You will be given a sugar-water solution to drink at the beginning of the test. You will have blood drawn before you drink the solution and then again 1, 2, and 3 hours after you drink it. You will not be allowed to eat or drink anything else during the test. You must remain at the testing location to make sure that your blood is drawn on time. You should also avoid exercising during the test, because exercise can alter test results. How do I prepare for this test? Eat normally for 3 days prior to the GTT test, including having plenty of carbohydrate-rich foods. Do not eat or drink anything except water during the final 12 hours before the test. In addition, your health care provider may ask you to stop taking certain medicines before the test. What do the results mean? It is your responsibility to obtain your test results. Ask the lab or department performing the test when and how you will get your results. Contact your health care provider to discuss any questions you have about your results. Range of Normal Values Ranges for normal values may vary among different labs and hospitals. You  should always check with your health care provider after having lab work or other tests done to discuss whether your values are considered within normal limits. Normal levels of blood glucose are as follows:  Fasting: less than 105 mg/dL.  1 hour after drinking the solution: less than 190 mg/dL.  2 hours after drinking the solution: less than 165 mg/dL.  3 hours after drinking the solution: less than 145 mg/dL.  Some substances can interfere with GTT results. These may include:  Blood pressure and heart failure medicines, including beta blockers, furosemide, and thiazides.  Anti-inflammatory medicines, including aspirin.  Nicotine.  Some psychiatric medicines.  Meaning of Results Outside Normal Value Ranges GTT test results that are above normal values may indicate a number of health problems, such as:  Gestational diabetes.  Acute stress response.  Cushing syndrome.  Tumors such as pheochromocytoma or glucagonoma.  Long-term kidney problems.  Pancreatitis.  Hyperthyroidism.  Current infection.  Discuss your test results with your health care provider. He or she will use the results to make a diagnosis and determine a treatment plan that is right for you. This information is not intended to replace advice given to you by your health care provider. Make sure you discuss any questions you have with your health care provider. Document Released: 07/25/2011 Document Revised: 07/01/2015 Document Reviewed: 05/30/2013 Elsevier Interactive Patient Education  2018 [employer]  Third Trimester of Pregnancy The third trimester is from week 28 through week 40 (months 7 through 9). The third  trimester is a time when the unborn baby (fetus) is growing rapidly. At the end of the ninth month, the fetus is about 20 inches in length and weighs 6-10 pounds. Body changes during your third trimester Your body will continue to go through many changes during pregnancy. The changes vary  from woman to woman. During the third trimester:  Your weight will continue to increase. You can expect to gain 25-35 pounds (11-16 kg) by the end of the pregnancy.  You may begin to get stretch marks on your hips, abdomen, and breasts.  You may urinate more often because the fetus is moving lower into your pelvis and pressing on your bladder.  You may develop or continue to have heartburn. This is caused by increased hormones that slow down muscles in the digestive tract.  You may develop or continue to have constipation because increased hormones slow digestion and cause the muscles that push waste through your intestines to relax.  You may develop hemorrhoids. These are swollen veins (varicose veins) in the rectum that can itch or be painful.  You may develop swollen, bulging veins (varicose veins) in your legs.  You may have increased body aches in the pelvis, back, or thighs. This is due to weight gain and increased hormones that are relaxing your joints.  You may have changes in your hair. These can include thickening of your hair, rapid growth, and changes in texture. Some women also have hair loss during or after pregnancy, or hair that feels dry or thin. Your hair will most likely return to normal after your baby is born.  Your breasts will continue to grow and they will continue to become tender. A yellow fluid (colostrum) may leak from your breasts. This is the first milk you are producing for your baby.  Your belly button may stick out.  You may notice more swelling in your hands, face, or ankles.  You may have increased tingling or numbness in your hands, arms, and legs. The skin on your belly may also feel numb.  You may feel short of breath because of your expanding uterus.  You may have more problems sleeping. This can be caused by the size of your belly, increased need to urinate, and an increase in your body's metabolism.  You may notice the fetus "dropping," or  moving lower in your abdomen (lightening).  You may have increased vaginal discharge.  You may notice your joints feel loose and you may have pain around your pelvic bone.  What to expect at prenatal visits You will have prenatal exams every 2 weeks until week 36. Then you will have weekly prenatal exams. During a routine prenatal visit:  You will be weighed to make sure you and the baby are growing normally.  Your blood pressure will be taken.  Your abdomen will be measured to track your baby's growth.  The fetal heartbeat will be listened to.  Any test results from the previous visit will be discussed.  You may have a cervical check near your due date to see if your cervix has softened or thinned (effaced).  You will be tested for Group B streptococcus. This happens between 35 and 37 weeks.  Your health care provider may ask you:  What your birth plan is.  How you are feeling.  If you are feeling the baby move.  If you have had any abnormal symptoms, such as leaking fluid, bleeding, severe headaches, or abdominal cramping.  If you are using any  tobacco products, including cigarettes, chewing tobacco, and electronic cigarettes.  If you have any questions.  Other tests or screenings that may be performed during your third trimester include:  Blood tests that check for low iron levels (anemia).  Fetal testing to check the health, activity level, and growth of the fetus. Testing is done if you have certain medical conditions or if there are problems during the pregnancy.  Nonstress test (NST). This test checks the health of your baby to make sure there are no signs of problems, such as the baby not getting enough oxygen. During this test, a belt is placed around your belly. The baby is made to move, and its heart rate is monitored during movement.  What is false labor? False labor is a condition in which you feel small, irregular tightenings of the muscles in the womb  (contractions) that usually go away with rest, changing position, or drinking water. These are called Braxton Hicks contractions. Contractions may last for hours, days, or even weeks before true labor sets in. If contractions come at regular intervals, become more frequent, increase in intensity, or become painful, you should see your health care provider. What are the signs of labor?  Abdominal cramps.  Regular contractions that start at 10 minutes apart and become stronger and more frequent with time.  Contractions that start on the top of the uterus and spread down to the lower abdomen and back.  Increased pelvic pressure and dull back pain.  A watery or bloody mucus discharge that comes from the vagina.  Leaking of amniotic fluid. This is also known as your "water breaking." It could be a slow trickle or a gush. Let your health care provider know if it has a color or strange odor. If you have any of these signs, call your health care provider right away, even if it is before your due date. Follow these instructions at home: Medicines  Follow your health care provider's instructions regarding medicine use. Specific medicines may be either safe or unsafe to take during pregnancy.  Take a prenatal vitamin that contains at least 600 micrograms (mcg) of folic acid.  If you develop constipation, try taking a stool softener if your health care provider approves. Eating and drinking  Eat a balanced diet that includes fresh fruits and vegetables, whole grains, good sources of protein such as meat, eggs, or tofu, and low-fat dairy. Your health care provider will help you determine the amount of weight gain that is right for you.  Avoid raw meat and uncooked cheese. These carry germs that can cause birth defects in the baby.  If you have low calcium intake from food, talk to your health care provider about whether you should take a daily calcium supplement.  Eat four or five small meals  rather than three large meals a day.  Limit foods that are high in fat and processed sugars, such as fried and sweet foods.  To prevent constipation: ? Drink enough fluid to keep your urine clear or pale yellow. ? Eat foods that are high in fiber, such as fresh fruits and vegetables, whole grains, and beans. Activity  Exercise only as directed by your health care provider. Most women can continue their usual exercise routine during pregnancy. Try to exercise for 30 minutes at least 5 days a week. Stop exercising if you experience uterine contractions.  Avoid heavy lifting.  Do not exercise in extreme heat or humidity, or at high altitudes.  Wear low-heel, comfortable shoes.  Practice good posture.  You may continue to have sex unless your health care provider tells you otherwise. Relieving pain and discomfort  Take frequent breaks and rest with your legs elevated if you have leg cramps or low back pain.  Take warm sitz baths to soothe any pain or discomfort caused by hemorrhoids. Use hemorrhoid cream if your health care provider approves.  Wear a good support bra to prevent discomfort from breast tenderness.  If you develop varicose veins: ? Wear support pantyhose or compression stockings as told by your healthcare provider. ? Elevate your feet for 15 minutes, 3-4 times a day. Prenatal care  Write down your questions. Take them to your prenatal visits.  Keep all your prenatal visits as told by your health care provider. This is important. Safety  Wear your seat belt at all times when driving.  Make a list of emergency phone numbers, including numbers for family, friends, the hospital, and police and fire departments. General instructions  Avoid cat litter boxes and soil used by cats. These carry germs that can cause birth defects in the baby. If you have a cat, ask someone to clean the litter box for you.  Do not travel far distances unless it is absolutely necessary and  only with the approval of your health care provider.  Do not use hot tubs, steam rooms, or saunas.  Do not drink alcohol.  Do not use any products that contain nicotine or tobacco, such as cigarettes and e-cigarettes. If you need help quitting, ask your health care provider.  Do not use any medicinal herbs or unprescribed drugs. These chemicals affect the formation and growth of the baby.  Do not douche or use tampons or scented sanitary pads.  Do not cross your legs for long periods of time.  To prepare for the arrival of your baby: ? Take prenatal classes to understand, practice, and ask questions about labor and delivery. ? Make a trial run to the hospital. ? Visit the hospital and tour the maternity area. ? Arrange for maternity or paternity leave through employers. ? Arrange for family and friends to take care of pets while you are in the hospital. ? Purchase a rear-facing car seat and make sure you know how to install it in your car. ? Pack your hospital bag. ? Prepare the baby's nursery. Make sure to remove all pillows and stuffed animals from the baby's crib to prevent suffocation.  Visit your dentist if you have not gone during your pregnancy. Use a soft toothbrush to brush your teeth and be gentle when you floss. Contact a health care provider if:  You are unsure if you are in labor or if your water has broken.  You become dizzy.  You have mild pelvic cramps, pelvic pressure, or nagging pain in your abdominal area.  You have lower back pain.  You have persistent nausea, vomiting, or diarrhea.  You have an unusual or bad smelling vaginal discharge.  You have pain when you urinate. Get help right away if:  Your water breaks before 37 weeks.  You have regular contractions less than 5 minutes apart before 37 weeks.  You have a fever.  You are leaking fluid from your vagina.  You have spotting or bleeding from your vagina.  You have severe abdominal pain or  cramping.  You have rapid weight loss or weight gain.  You have shortness of breath with chest pain.  You notice sudden or extreme swelling of your face, hands,  ankles, feet, or legs.  Your baby makes fewer than 10 movements in 2 hours.  You have severe headaches that do not go away when you take medicine.  You have vision changes. Summary  The third trimester is from week 28 through week 40, months 7 through 9. The third trimester is a time when the unborn baby (fetus) is growing rapidly.  During the third trimester, your discomfort may increase as you and your baby continue to gain weight. You may have abdominal, leg, and back pain, sleeping problems, and an increased need to urinate.  During the third trimester your breasts will keep growing and they will continue to become tender. A yellow fluid (colostrum) may leak from your breasts. This is the first milk you are producing for your baby.  False labor is a condition in which you feel small, irregular tightenings of the muscles in the womb (contractions) that eventually go away. These are called Braxton Hicks contractions. Contractions may last for hours, days, or even weeks before true labor sets in.  Signs of labor can include: abdominal cramps; regular contractions that start at 10 minutes apart and become stronger and more frequent with time; watery or bloody mucus discharge that comes from the vagina; increased pelvic pressure and dull back pain; and leaking of amniotic fluid. This information is not intended to replace advice given to you by your health care provider. Make sure you discuss any questions you have with your health care provider. Document Released: 01/17/2001 Document Revised: 07/01/2015 Document Reviewed: 03/26/2012 Elsevier Interactive Patient Education  2017 [employer].

## 2017-03-13 NOTE — Progress Notes (Signed)
Pt denies concerns at this time.  Pt requesting refill on PNV.

## 2017-03-13 NOTE — Progress Notes (Signed)
   PRENATAL VISIT NOTE  Subjective:  Brittany Berg is a 22 y.o. G1P0 at 3745w4d being seen today for ongoing prenatal care.  She is currently monitored for the following issues for this low-risk pregnancy and has Encounter for supervision of normal first pregnancy in second trimester; Obesity in pregnancy, antepartum; and Maternal varicella, non-immune on their problem list.  Patient reports no complaints.  Contractions: Not present. Vag. Bleeding: None.  Movement: Present. Denies leaking of fluid.   The following portions of the patient's history were reviewed and updated as appropriate: allergies, current medications, past family history, past medical history, past social history, past surgical history and problem list. Problem list updated.  Objective:   Vitals:   03/13/17 0825  BP: (!) 121/58  Pulse: 77  Weight: 216 lb (98 kg)    Fetal Status: Fetal Heart Rate (bpm): 140; doppler Fundal Height: 24 cm Movement: Present     General:  Alert, oriented and cooperative. Patient is in no acute distress.  Skin: Skin is warm and dry. No rash noted.   Cardiovascular: Normal heart rate noted  Respiratory: Normal respiratory effort, no problems with respiration noted  Abdomen: Soft, gravid, appropriate for gestational age.  Pain/Pressure: Absent     Pelvic: Cervical exam deferred        Extremities: Normal range of motion.  Edema: None  Mental Status:  Normal mood and affect. Normal behavior. Normal judgment and thought content.   Assessment and Plan:  Pregnancy: G1P0 at 2345w4d  1. Encounter for supervision of normal first pregnancy in second trimester     Doing well.  - Prenat-FeAsp-Meth-FA-DHA w/o A (PRENATE PIXIE) 10-0.6-0.4-200 MG CAPS; Take 1 tablet by mouth daily.  Dispense: 30 capsule; Refill: 12  2. Maternal varicella, non-immune     Varicella postpartum  Preterm labor symptoms and general obstetric precautions including but not limited to vaginal bleeding, contractions, leaking  of fluid and fetal movement were reviewed in detail with the patient. Please refer to After Visit Summary for other counseling recommendations.  Return in about 4 weeks (around 04/10/2017) for ROB, 2 hr OGTT.   Roe Coombsachelle A Tymia Streb, CNM

## 2017-03-16 ENCOUNTER — Encounter: Payer: Self-pay | Admitting: Certified Nurse Midwife

## 2017-03-16 ENCOUNTER — Other Ambulatory Visit: Payer: Self-pay

## 2017-03-16 ENCOUNTER — Other Ambulatory Visit: Payer: Self-pay | Admitting: Certified Nurse Midwife

## 2017-03-16 DIAGNOSIS — R8271 Bacteriuria: Secondary | ICD-10-CM | POA: Insufficient documentation

## 2017-03-16 MED ORDER — AMOXICILLIN 500 MG PO CAPS: 500.0000 mg | ORAL_CAPSULE | Freq: Three times a day (TID) | ORAL | 0 refills | Status: AC

## 2017-03-16 NOTE — Progress Notes (Signed)
Patient called. Patient aware of positive results of GBS. Prescription sent to pharmacy of choice.   Sharyon CableVeronica C Delbert Vu, CNM  03/16/17, 8:33 AM

## 2017-03-21 ENCOUNTER — Encounter: Payer: Self-pay | Admitting: Certified Nurse Midwife

## 2017-03-22 ENCOUNTER — Telehealth: Payer: Self-pay

## 2017-03-22 NOTE — Telephone Encounter (Signed)
Attempted to contact. No answer, mailbox full could not leave vm.

## 2017-03-23 ENCOUNTER — Telehealth: Payer: Self-pay | Admitting: Certified Nurse Midwife

## 2017-03-23 ENCOUNTER — Encounter (HOSPITAL_COMMUNITY): Payer: Self-pay

## 2017-03-23 ENCOUNTER — Encounter: Payer: Self-pay | Admitting: Certified Nurse Midwife

## 2017-03-23 ENCOUNTER — Other Ambulatory Visit: Payer: Self-pay

## 2017-03-23 ENCOUNTER — Inpatient Hospital Stay (HOSPITAL_COMMUNITY)
Admission: AD | Admit: 2017-03-23 | Discharge: 2017-03-23 | Disposition: A | Discharge status: Home / Self Care | Payer: Medicaid Other | Source: Ambulatory Visit | Attending: Obstetrics and Gynecology | Admitting: Obstetrics and Gynecology

## 2017-03-23 DIAGNOSIS — O99512 Diseases of the respiratory system complicating pregnancy, second trimester: Secondary | ICD-10-CM | POA: Diagnosis not present

## 2017-03-23 DIAGNOSIS — J029 Acute pharyngitis, unspecified: Secondary | ICD-10-CM | POA: Diagnosis present

## 2017-03-23 DIAGNOSIS — O9989 Other specified diseases and conditions complicating pregnancy, childbirth and the puerperium: Secondary | ICD-10-CM | POA: Diagnosis not present

## 2017-03-23 DIAGNOSIS — J069 Acute upper respiratory infection, unspecified: Secondary | ICD-10-CM

## 2017-03-23 DIAGNOSIS — R05 Cough: Secondary | ICD-10-CM | POA: Insufficient documentation

## 2017-03-23 DIAGNOSIS — O98512 Other viral diseases complicating pregnancy, second trimester: Secondary | ICD-10-CM | POA: Insufficient documentation

## 2017-03-23 DIAGNOSIS — Z3A25 25 weeks gestation of pregnancy: Secondary | ICD-10-CM | POA: Diagnosis present

## 2017-03-23 DIAGNOSIS — R51 Headache: Secondary | ICD-10-CM | POA: Diagnosis not present

## 2017-03-23 DIAGNOSIS — J020 Streptococcal pharyngitis: Secondary | ICD-10-CM

## 2017-03-23 DIAGNOSIS — R8271 Bacteriuria: Secondary | ICD-10-CM

## 2017-03-23 DIAGNOSIS — O26892 Other specified pregnancy related conditions, second trimester: Secondary | ICD-10-CM | POA: Diagnosis present

## 2017-03-23 LAB — URINALYSIS, ROUTINE W REFLEX MICROSCOPIC
BILIRUBIN URINE: NEGATIVE
GLUCOSE, UA: NEGATIVE mg/dL
Hgb urine dipstick: NEGATIVE
Ketones, ur: NEGATIVE mg/dL
Nitrite: NEGATIVE
PROTEIN: NEGATIVE mg/dL
Specific Gravity, Urine: 1.02 (ref 1.005–1.030)
pH: 6 (ref 5.0–8.0)

## 2017-03-23 LAB — INFLUENZA PANEL BY PCR (TYPE A & B)
INFLAPCR: NEGATIVE
INFLBPCR: NEGATIVE

## 2017-03-23 LAB — RAPID STREP SCREEN (MED CTR MEBANE ONLY): STREPTOCOCCUS, GROUP A SCREEN (DIRECT): POSITIVE — AB

## 2017-03-23 MED ORDER — PENICILLIN V POTASSIUM 500 MG PO TABS: 500.0000 mg | ORAL_TABLET | Freq: Two times a day (BID) | ORAL | 0 refills | Status: AC

## 2017-03-23 NOTE — MAU Provider Note (Signed)
History     CSN: [id number]  Arrival date and time: 03/23/17 1647   Chief Complaint  Patient presents with  . Sore throat  . Headache  . Cough   G1 @25 .0 wks here with cough and sore throat. Sore throat started 4 days ago. Mostly just at night. Cough started yesterday, and nonproductive. No fever, chills, or body aches. No no sick contacts. Has not used any OTC meds yet. No pregnancy complaints. Good FM.     OB History    Gravida Para Term Preterm AB Living   1             SAB TAB Ectopic Multiple Live Births                  Past Medical History:  Diagnosis Date  . Medical history non-contributory     Past Surgical History:  Procedure Laterality Date  . WISDOM TOOTH EXTRACTION      History reviewed. No pertinent family history.  Social History   Tobacco Use  . Smoking status: Never Smoker  . Smokeless tobacco: Never Used  Substance Use Topics  . Alcohol use: No  . Drug use: No    Allergies: No Known Allergies  Medications Prior to Admission  Medication Sig Dispense Refill Last Dose  . amoxicillin (AMOXIL) 500 MG capsule Take 1 capsule (500 mg total) by mouth 3 (three) times daily for 7 days. 21 capsule 0   . metoCLOPramide (REGLAN) 10 MG tablet Take 1 tablet (10 mg total) by mouth 3 (three) times daily before meals. (Patient not taking: Reported on 02/12/2017) 60 tablet 2 Not Taking  . Prenat-FeAsp-Meth-FA-DHA w/o A (PRENATE PIXIE) 10-0.6-0.4-200 MG CAPS Take 1 tablet by mouth daily. 30 capsule 12   . promethazine (PHENERGAN) 25 MG tablet Take at bedtime if taking Reglan during the day. (Patient not taking: Reported on 02/12/2017) 30 tablet 2 Not Taking    Review of Systems  Constitutional: Negative for chills and fever.  HENT: Positive for congestion and sore throat. Negative for ear pain.   Respiratory: Positive for cough. Negative for shortness of breath.    Physical Exam   Blood pressure 120/67, pulse (!) 106, temperature 98 F (36.7 C), temperature  source Oral, resp. rate 18, height 5\' 6"  (1.676 m), weight 216 lb 12 oz (98.3 kg), last menstrual period 09/29/2016, SpO2 96 %.  Physical Exam  Nursing note and vitals reviewed. Constitutional: She appears well-developed and well-nourished. No distress.  HENT:  Head: Normocephalic and atraumatic.  Right Ear: Hearing, tympanic membrane, external ear and ear canal normal.  Left Ear: Hearing, tympanic membrane, external ear and ear canal normal.  Nose: Nose normal.  Mouth/Throat: Uvula is midline and mucous membranes are normal. Posterior oropharyngeal erythema present. No oropharyngeal exudate, posterior oropharyngeal edema or tonsillar abscesses.  Neck: Normal range of motion.  Cardiovascular: Normal rate, regular rhythm and normal heart sounds.  Respiratory: Effort normal and breath sounds normal. No respiratory distress. She has no wheezes. She has no rales.  Musculoskeletal: Normal range of motion.  Neurological: She is alert.  Skin: Skin is warm and dry.  Psychiatric: She has a normal mood and affect.  EFM: 135 bpm, mod variability, no accels, no decels Toco: none  Results for orders placed or performed during the hospital encounter of 03/23/17 (from the past 24 hour(s))  Urinalysis, Routine w reflex microscopic     Status: Abnormal   Collection Time: 03/23/17  5:00 PM  Result Value Ref Range  Color, Urine YELLOW YELLOW   APPearance CLEAR CLEAR   Specific Gravity, Urine 1.020 1.005 - 1.030   pH 6.0 5.0 - 8.0   Glucose, UA NEGATIVE NEGATIVE mg/dL   Hgb urine dipstick NEGATIVE NEGATIVE   Bilirubin Urine NEGATIVE NEGATIVE   Ketones, ur NEGATIVE NEGATIVE mg/dL   Protein, ur NEGATIVE NEGATIVE mg/dL   Nitrite NEGATIVE NEGATIVE   Leukocytes, UA SMALL (A) NEGATIVE   RBC / HPF 0-5 0 - 5 RBC/hpf   WBC, UA 6-30 0 - 5 WBC/hpf   Bacteria, UA RARE (A) NONE SEEN   Squamous Epithelial / LPF 0-5 (A) NONE SEEN   Mucus PRESENT   Influenza panel by PCR (type A & B)     Status: None    Collection Time: 03/23/17  5:20 PM  Result Value Ref Range   Influenza A By PCR NEGATIVE NEGATIVE   Influenza B By PCR NEGATIVE NEGATIVE    MAU Course  Procedures  MDM Labs ordered and reviewed. No evidence of Influenza. Strep pending. Discussed supportive measures for URV. Stable for discharge home.  Assessment and Plan  [redacted] weeks gestation Upper respiratory virus  Discharge home OTC Medication list provided Notify MD of worsening sx  Allergies as of 03/23/2017   No Known Allergies     Medication List    STOP taking these medications   metoCLOPramide 10 MG tablet Commonly known as:  REGLAN   promethazine 25 MG tablet Commonly known as:  PHENERGAN     TAKE these medications   amoxicillin 500 MG capsule Commonly known as:  AMOXIL Take 1 capsule (500 mg total) by mouth 3 (three) times daily for 7 days.   PRENATE PIXIE 10-0.6-0.4-200 MG Caps Take 1 tablet by mouth daily.       Brittany Berg, CNM 03/23/2017, 6:43 PM

## 2017-03-23 NOTE — Telephone Encounter (Signed)
Pt notified +strep throat and PCN called to pharmacy. Instructed not to take Amoxicillin (given for UTI).

## 2017-03-23 NOTE — Discharge Instructions (Signed)
Upper Respiratory Infection, Adult Most upper respiratory infections (URIs) are caused by a virus. A URI affects the nose, throat, and upper air passages. The most common type of URI is often called "the common cold." Follow these instructions at home:  Take medicines only as told by your doctor.  Gargle warm saltwater or take cough drops to comfort your throat as told by your doctor.  Use a warm mist humidifier or inhale steam from a shower to increase air moisture. This may make it easier to breathe.  Drink enough fluid to keep your pee (urine) clear or pale yellow.  Eat soups and other clear broths.  Have a healthy diet.  Rest as needed.  Go back to work when your fever is gone or your doctor says it is okay. ? You may need to stay home longer to avoid giving your URI to others. ? You can also wear a face mask and wash your hands often to prevent spread of the virus.  Use your inhaler more if you have asthma.  Do not use any tobacco products, including cigarettes, chewing tobacco, or electronic cigarettes. If you need help quitting, ask your doctor. Contact a doctor if:  You are getting worse, not better.  Your symptoms are not helped by medicine.  You have chills.  You are getting more short of breath.  You have brown or red mucus.  You have yellow or brown discharge from your nose.  You have pain in your face, especially when you bend forward.  You have a fever.  You have puffy (swollen) neck glands.  You have pain while swallowing.  You have white areas in the back of your throat. Get help right away if:  You have very bad or constant: ? Headache. ? Ear pain. ? Pain in your forehead, behind your eyes, and over your cheekbones (sinus pain). ? Chest pain.  You have long-lasting (chronic) lung disease and any of the following: ? Wheezing. ? Long-lasting cough. ? Coughing up blood. ? A change in your usual mucus.  You have a stiff neck.  You have  changes in your: ? Vision. ? Hearing. ? Thinking. ? Mood. This information is not intended to replace advice given to you by your health care provider. Make sure you discuss any questions you have with your health care provider. Document Released: 07/12/2007 Document Revised: 09/26/2015 Document Reviewed: 04/30/2013 Elsevier Interactive Patient Education  2018 [employer]  

## 2017-03-23 NOTE — MAU Note (Signed)
Pt presents with c/o nonproductive cough, sore throat, and headache since Monday.  Hasn't taken temperature, unsure if had fever, reports felt hot yesterday. Pregnancy:  Denies VB or LOF, reports +FM

## 2017-03-23 NOTE — Telephone Encounter (Signed)
No answer. Voicemail box full. Mychart message sent. Rx sent.

## 2017-04-10 ENCOUNTER — Encounter: Payer: Self-pay | Admitting: Certified Nurse Midwife

## 2017-04-10 ENCOUNTER — Ambulatory Visit (INDEPENDENT_AMBULATORY_CARE_PROVIDER_SITE_OTHER): Payer: Medicaid Other | Admitting: Certified Nurse Midwife

## 2017-04-10 ENCOUNTER — Other Ambulatory Visit: Payer: Medicaid Other

## 2017-04-10 VITALS — BP 135/76 | HR 90 | Wt 223.0 lb

## 2017-04-10 DIAGNOSIS — Z3402 Encounter for supervision of normal first pregnancy, second trimester: Secondary | ICD-10-CM

## 2017-04-10 DIAGNOSIS — O09899 Supervision of other high risk pregnancies, unspecified trimester: Secondary | ICD-10-CM

## 2017-04-10 DIAGNOSIS — R8271 Bacteriuria: Secondary | ICD-10-CM

## 2017-04-10 DIAGNOSIS — Z283 Underimmunization status: Secondary | ICD-10-CM

## 2017-04-10 NOTE — Progress Notes (Signed)
   PRENATAL VISIT NOTE  Subjective:  Brittany Berg is a 22 y.o. G1P0 at 1740w4d being seen today for ongoing prenatal care.  She is currently monitored for the following issues for this low-risk pregnancy and has Encounter for supervision of normal first pregnancy in second trimester; Obesity in pregnancy, antepartum; Maternal varicella, non-immune; and GBS bacteriuria on their problem list.  Patient reports no complaints.  Contractions: Not present. Vag. Bleeding: None.  Movement: Present. Denies leaking of fluid.   The following portions of the patient's history were reviewed and updated as appropriate: allergies, current medications, past family history, past medical history, past social history, past surgical history and problem list. Problem list updated.  Objective:   Vitals:   04/10/17 0925  BP: 135/76  Pulse: 90  Weight: 223 lb (101.2 kg)    Fetal Status: Fetal Heart Rate (bpm): 141; doppler Fundal Height: 29 cm Movement: Present     General:  Alert, oriented and cooperative. Patient is in no acute distress.  Skin: Skin is warm and dry. No rash noted.   Cardiovascular: Normal heart rate noted  Respiratory: Normal respiratory effort, no problems with respiration noted  Abdomen: Soft, gravid, appropriate for gestational age.  Pain/Pressure: Absent     Pelvic: Cervical exam deferred        Extremities: Normal range of motion.     Mental Status:  Normal mood and affect. Normal behavior. Normal judgment and thought content.   Assessment and Plan:  Pregnancy: G1P0 at 7240w4d  1. Encounter for supervision of normal first pregnancy in second trimester      Doing well. [place] moving [place] soon.  Discussed areas to get good prenatal care.  - Glucose Tolerance, 2 Hours w/1 Hour - CBC - HIV antibody - RPR  2. Maternal varicella, non-immune     Varicella postpartum  3. GBS bacteriuria     PCN for labor/delivery  Preterm labor symptoms and general obstetric precautions  including but not limited to vaginal bleeding, contractions, leaking of fluid and fetal movement were reviewed in detail with the patient. Please refer to After Visit Summary for other counseling recommendations.  Return in about 2 weeks (around 04/24/2017) for ROB.   Roe Coombsachelle A Kymberlie Brazeau, CNM

## 2017-04-10 NOTE — Patient Instructions (Addendum)
Preventing Preterm Birth Preterm birth is when your baby is delivered between 72 weeks and 37 weeks of pregnancy. A full-term pregnancy lasts for at least 37 weeks. Preterm birth can be dangerous for your baby because the last few weeks of pregnancy are an important time for your baby's brain and lungs to grow. Many things can cause a baby to be born early. Sometimes the cause is not known. There are certain factors that make you more likely to experience preterm birth, such as:  Having a previous baby born preterm.  Being pregnant with twins or other multiples.  Having had fertility treatment.  Being overweight or underweight at the start of your pregnancy.  Having any of the following during pregnancy: ? An infection, including a urinary tract infection (UTI) or an STI (sexually transmitted infection). ? High blood pressure. ? Diabetes. ? Vaginal bleeding.  Being age 38 or older.  Being age 50 or younger.  Getting pregnant within 6 months of a previous pregnancy.  Suffering extreme stress or physical or emotional abuse during pregnancy.  Standing for long periods of time during pregnancy, such as working at a job that requires standing.  What are the risks? The most serious risk of preterm birth is that the baby may not survive. This is more likely to happen if a baby is born before 13 weeks. Other risks and complications of preterm birth may include your baby having:  Breathing problems.  Brain damage that affects movement and coordination (cerebral palsy).  Feeding difficulties.  Vision or hearing problems.  Infections or inflammation of the digestive tract (colitis).  Developmental delays.  Learning disabilities.  Higher risk for diabetes, heart disease, and high blood pressure later in life.  What can I do to lower my risk? Medical care  The most important thing you can do to lower your risk for preterm birth is to get routine medical care during pregnancy  (prenatal care). If you have a high risk of preterm birth, you may be referred to a health care provider who specializes in managing high-risk pregnancies (perinatologist). You may be given medicine to help prevent preterm birth. Lifestyle changes Certain lifestyle changes can also lower your risk of preterm birth:  Wait at least 6 months after a pregnancy to become pregnant again.  Try to plan pregnancy for when you are between 57 and 77 years old.  Get to a healthy weight before getting pregnant. If you are overweight, work with your health care provider to safely lose weight.  Do not use any products that contain nicotine or tobacco, such as cigarettes and e-cigarettes. If you need help quitting, ask your health care provider.  Do not drink alcohol.  Do not use drugs.  Where to find support: For more support, consider:  Talking with your health care provider.  Talking with a therapist or substance abuse counselor, if you need help quitting.  Working with a diet and nutrition specialist (dietitian) or a Physiological scientist to maintain a healthy weight.  Joining a support group.  Where to find more information: Learn more about preventing preterm birth from:  Centers for Disease Control and Prevention: VoipObserver.com.br  March of Dimes: marchofdimes.org/complications/premature-babies.aspx  American Pregnancy Association: americanpregnancy.org/labor-and-birth/premature-labor  Contact a health care provider if:  You have any of the following signs of preterm labor before 37 weeks: ? A change or increase in vaginal discharge. ? Fluid leaking from your vagina. ? Pressure or cramps in your lower abdomen. ? A backache that does not  go away or gets worse. ? Regular tightening (contractions) in your lower abdomen. Summary  Preterm birth means having your baby during weeks 20-37 of pregnancy.  Preterm birth may put your baby at risk  for physical and mental problems.  Getting good prenatal care can help prevent preterm birth.  You can lower your risk of preterm birth by making certain lifestyle changes, such as not smoking and not using alcohol. This information is not intended to replace advice given to you by your health care provider. Make sure you discuss any questions you have with your health care provider. Document Released: 03/09/2015 Document Revised: 10/02/2015 Document Reviewed: 10/02/2015 Elsevier Interactive Patient Education  2018 [employer].  Before Baby Comes Home Before your baby arrives it is important to:  Have all of the supplies that you will need to care for your baby.  Know where to go if there is an emergency.  Discuss the baby's arrival with other family members.  What supplies will I need?  It is recommended that you have the following supplies: Large Items  Crib.  Crib mattress.  Rear-facing infant car seat. If possible, have a trained professional check to make sure that it is installed correctly.  Feeding  6-8 bottles that are 4-5 oz in size.  6-8 nipples.  Bottle brush.  Sterilizer, or a large pan or kettle with a lid.  A way to boil and cool water.  If you will be breastfeeding: ? Breast pump. ? Nipple cream. ? Nursing bra. ? Breast pads. ? Breast shields.  If you will be formula feeding: ? Formula. ? Measuring cups. ? Measuring spoons.  Bathing  Mild baby soap and baby shampoo.  Petroleum jelly.  Soft cloth towel and washcloth.  Hooded towel.  Cotton balls.  Bath basin.  Other Supplies  Rectal thermometer.  Bulb syringe.  Baby wipes or washcloths for diaper changes.  Diaper bag.  Changing pad.  Clothing, including one-piece outfits and pajamas.  Baby nail clippers.  Receiving blankets.  Mattress pad and sheets for the crib.  Night-light for the baby's room.  Baby monitor.  2 or 3 pacifiers.  Either 24-36 cloth diapers  and waterproof diaper covers or a box of disposable diapers. You may need to use as many as 10-12 diapers per day.  How do I prepare for an emergency? Prepare for an emergency by:  Knowing how to get to the nearest hospital.  Listing the phone numbers of your baby's health care providers near your home phone and in your cell phone.  How do I prepare my family?  Decide how to handle visitors.  If you have other children: ? Talk with them about the baby coming home. Ask them how they feel about it. ? Read a book together about being a new big brother or sister. ? Find ways to let them help you prepare for the new baby. ? Have someone ready to care for them while you are in the hospital. This information is not intended to replace advice given to you by your health care provider. Make sure you discuss any questions you have with your health care provider. Document Released: 01/06/2008 Document Revised: 07/01/2015 Document Reviewed: 12/31/2013 Elsevier Interactive Patient Education  2018 [employer].  [employer] of the uterus can occur throughout pregnancy, but they are not always a sign that you are in labor. You may have practice contractions called Braxton Hicks contractions. These false labor contractions are sometimes confused with true  labor. What are Deberah Pelton contractions? Braxton Hicks contractions are tightening movements that occur in the muscles of the uterus before labor. Unlike true labor contractions, these contractions do not result in opening (dilation) and thinning of the cervix. Toward the end of pregnancy (32-34 weeks), Braxton Hicks contractions can happen more often and may become stronger. These contractions are sometimes difficult to tell apart from true labor because they can be very uncomfortable. You should not feel embarrassed if you go to the hospital with false labor. Sometimes, the only way to tell if you are in true labor is for  your health care provider to look for changes in the cervix. The health care provider will do a physical exam and may monitor your contractions. If you are not in true labor, the exam should show that your cervix is not dilating and your water has not broken. If there are other health problems associated with your pregnancy, it is completely safe for you to be sent home with false labor. You may continue to have Braxton Hicks contractions until you go into true labor. How to tell the difference between true labor and false labor True labor  Contractions last 30-70 seconds.  Contractions become very regular.  Discomfort is usually felt in the top of the uterus, and it spreads to the lower abdomen and low back.  Contractions do not go away with walking.  Contractions usually become more intense and increase in frequency.  The cervix dilates and gets thinner. False labor  Contractions are usually shorter and not as strong as true labor contractions.  Contractions are usually irregular.  Contractions are often felt in the front of the lower abdomen and in the groin.  Contractions may go away when you walk around or change positions while lying down.  Contractions get weaker and are shorter-lasting as time goes on.  The cervix usually does not dilate or become thin. Follow these instructions at home:  Take over-the-counter and prescription medicines only as told by your health care provider.  Keep up with your usual exercises and follow other instructions from your health care provider.  Eat and drink lightly if you think you are going into labor.  If Braxton Hicks contractions are making you uncomfortable: ? Change your position from lying down or resting to walking, or change from walking to resting. ? Sit and rest in a tub of warm water. ? Drink enough fluid to keep your urine pale yellow. Dehydration may cause these contractions. ? Do slow and deep breathing several times an  hour.  Keep all follow-up prenatal visits as told by your health care provider. This is important. Contact a health care provider if:  You have a fever.  You have continuous pain in your abdomen. Get help right away if:  Your contractions become stronger, more regular, and closer together.  You have fluid leaking or gushing from your vagina.  You pass blood-tinged mucus (bloody show).  You have bleeding from your vagina.  You have low back pain that you never had before.  You feel your baby's head pushing down and causing pelvic pressure.  Your baby is not moving inside you as much as it used to. Summary  Contractions that occur before labor are called Braxton Hicks contractions, false labor, or practice contractions.  Braxton Hicks contractions are usually shorter, weaker, farther apart, and less regular than true labor contractions. True labor contractions usually become progressively stronger and regular and they become more frequent.  Manage  discomfort from [facility] contractions by changing position, resting in a warm bath, drinking plenty of water, or practicing deep breathing. This information is not intended to replace advice given to you by your health care provider. Make sure you discuss any questions you have with your health care provider. Document Released: 06/08/2016 Document Revised: 06/08/2016 Document Reviewed: 06/08/2016 Elsevier Interactive Patient Education  2018 [employer].  Third Trimester of Pregnancy The third trimester is from week 28 through week 40 (months 7 through 9). The third trimester is a time when the unborn baby (fetus) is growing rapidly. At the end of the ninth month, the fetus is about 20 inches in length and weighs 6-10 pounds. Body changes during your third trimester Your body will continue to go through many changes during pregnancy. The changes vary from woman to woman. During the third trimester:  Your weight will continue to  increase. You can expect to gain 25-35 pounds (11-16 kg) by the end of the pregnancy.  You may begin to get stretch marks on your hips, abdomen, and breasts.  You may urinate more often because the fetus is moving lower into your pelvis and pressing on your bladder.  You may develop or continue to have heartburn. This is caused by increased hormones that slow down muscles in the digestive tract.  You may develop or continue to have constipation because increased hormones slow digestion and cause the muscles that push waste through your intestines to relax.  You may develop hemorrhoids. These are swollen veins (varicose veins) in the rectum that can itch or be painful.  You may develop swollen, bulging veins (varicose veins) in your legs.  You may have increased body aches in the pelvis, back, or thighs. This is due to weight gain and increased hormones that are relaxing your joints.  You may have changes in your hair. These can include thickening of your hair, rapid growth, and changes in texture. Some women also have hair loss during or after pregnancy, or hair that feels dry or thin. Your hair will most likely return to normal after your baby is born.  Your breasts will continue to grow and they will continue to become tender. A yellow fluid (colostrum) may leak from your breasts. This is the first milk you are producing for your baby.  Your belly button may stick out.  You may notice more swelling in your hands, face, or ankles.  You may have increased tingling or numbness in your hands, arms, and legs. The skin on your belly may also feel numb.  You may feel short of breath because of your expanding uterus.  You may have more problems sleeping. This can be caused by the size of your belly, increased need to urinate, and an increase in your body's metabolism.  You may notice the fetus "dropping," or moving lower in your abdomen (lightening).  You may have increased vaginal  discharge.  You may notice your joints feel loose and you may have pain around your pelvic bone.  What to expect at prenatal visits You will have prenatal exams every 2 weeks until week 36. Then you will have weekly prenatal exams. During a routine prenatal visit:  You will be weighed to make sure you and the baby are growing normally.  Your blood pressure will be taken.  Your abdomen will be measured to track your baby's growth.  The fetal heartbeat will be listened to.  Any test results from the previous visit will be discussed.  You may have a cervical check near your due date to see if your cervix has softened or thinned (effaced).  You will be tested for Group B streptococcus. This happens between 35 and 37 weeks.  Your health care provider may ask you:  What your birth plan is.  How you are feeling.  If you are feeling the baby move.  If you have had any abnormal symptoms, such as leaking fluid, bleeding, severe headaches, or abdominal cramping.  If you are using any tobacco products, including cigarettes, chewing tobacco, and electronic cigarettes.  If you have any questions.  Other tests or screenings that may be performed during your third trimester include:  Blood tests that check for low iron levels (anemia).  Fetal testing to check the health, activity level, and growth of the fetus. Testing is done if you have certain medical conditions or if there are problems during the pregnancy.  Nonstress test (NST). This test checks the health of your baby to make sure there are no signs of problems, such as the baby not getting enough oxygen. During this test, a belt is placed around your belly. The baby is made to move, and its heart rate is monitored during movement.  What is false labor? False labor is a condition in which you feel small, irregular tightenings of the muscles in the womb (contractions) that usually go away with rest, changing position, or drinking  water. These are called Braxton Hicks contractions. Contractions may last for hours, days, or even weeks before true labor sets in. If contractions come at regular intervals, become more frequent, increase in intensity, or become painful, you should see your health care provider. What are the signs of labor?  Abdominal cramps.  Regular contractions that start at 10 minutes apart and become stronger and more frequent with time.  Contractions that start on the top of the uterus and spread down to the lower abdomen and back.  Increased pelvic pressure and dull back pain.  A watery or bloody mucus discharge that comes from the vagina.  Leaking of amniotic fluid. This is also known as your "water breaking." It could be a slow trickle or a gush. Let your health care provider know if it has a color or strange odor. If you have any of these signs, call your health care provider right away, even if it is before your due date. Follow these instructions at home: Medicines  Follow your health care provider's instructions regarding medicine use. Specific medicines may be either safe or unsafe to take during pregnancy.  Take a prenatal vitamin that contains at least 600 micrograms (mcg) of folic acid.  If you develop constipation, try taking a stool softener if your health care provider approves. Eating and drinking  Eat a balanced diet that includes fresh fruits and vegetables, whole grains, good sources of protein such as meat, eggs, or tofu, and low-fat dairy. Your health care provider will help you determine the amount of weight gain that is right for you.  Avoid raw meat and uncooked cheese. These carry germs that can cause birth defects in the baby.  If you have low calcium intake from food, talk to your health care provider about whether you should take a daily calcium supplement.  Eat four or five small meals rather than three large meals a day.  Limit foods that are high in fat and  processed sugars, such as fried and sweet foods.  To prevent constipation: ? Drink enough fluid to  keep your urine clear or pale yellow. ? Eat foods that are high in fiber, such as fresh fruits and vegetables, whole grains, and beans. Activity  Exercise only as directed by your health care provider. Most women can continue their usual exercise routine during pregnancy. Try to exercise for 30 minutes at least 5 days a week. Stop exercising if you experience uterine contractions.  Avoid heavy lifting.  Do not exercise in extreme heat or humidity, or at high altitudes.  Wear low-heel, comfortable shoes.  Practice good posture.  You may continue to have sex unless your health care provider tells you otherwise. Relieving pain and discomfort  Take frequent breaks and rest with your legs elevated if you have leg cramps or low back pain.  Take warm sitz baths to soothe any pain or discomfort caused by hemorrhoids. Use hemorrhoid cream if your health care provider approves.  Wear a good support bra to prevent discomfort from breast tenderness.  If you develop varicose veins: ? Wear support pantyhose or compression stockings as told by your healthcare provider. ? Elevate your feet for 15 minutes, 3-4 times a day. Prenatal care  Write down your questions. Take them to your prenatal visits.  Keep all your prenatal visits as told by your health care provider. This is important. Safety  Wear your seat belt at all times when driving.  Make a list of emergency phone numbers, including numbers for family, friends, the hospital, and police and fire departments. General instructions  Avoid cat litter boxes and soil used by cats. These carry germs that can cause birth defects in the baby. If you have a cat, ask someone to clean the litter box for you.  Do not travel far distances unless it is absolutely necessary and only with the approval of your health care provider.  Do not use hot tubs,  steam rooms, or saunas.  Do not drink alcohol.  Do not use any products that contain nicotine or tobacco, such as cigarettes and e-cigarettes. If you need help quitting, ask your health care provider.  Do not use any medicinal herbs or unprescribed drugs. These chemicals affect the formation and growth of the baby.  Do not douche or use tampons or scented sanitary pads.  Do not cross your legs for long periods of time.  To prepare for the arrival of your baby: ? Take prenatal classes to understand, practice, and ask questions about labor and delivery. ? Make a trial run to the hospital. ? Visit the hospital and tour the maternity area. ? Arrange for maternity or paternity leave through employers. ? Arrange for family and friends to take care of pets while you are in the hospital. ? Purchase a rear-facing car seat and make sure you know how to install it in your car. ? Pack your hospital bag. ? Prepare the baby's nursery. Make sure to remove all pillows and stuffed animals from the baby's crib to prevent suffocation.  Visit your dentist if you have not gone during your pregnancy. Use a soft toothbrush to brush your teeth and be gentle when you floss. Contact a health care provider if:  You are unsure if you are in labor or if your water has broken.  You become dizzy.  You have mild pelvic cramps, pelvic pressure, or nagging pain in your abdominal area.  You have lower back pain.  You have persistent nausea, vomiting, or diarrhea.  You have an unusual or bad smelling vaginal discharge.  You have pain  when you urinate. Get help right away if:  Your water breaks before 37 weeks.  You have regular contractions less than 5 minutes apart before 37 weeks.  You have a fever.  You are leaking fluid from your vagina.  You have spotting or bleeding from your vagina.  You have severe abdominal pain or cramping.  You have rapid weight loss or weight gain.  You have shortness  of breath with chest pain.  You notice sudden or extreme swelling of your face, hands, ankles, feet, or legs.  Your baby makes fewer than 10 movements in 2 hours.  You have severe headaches that do not go away when you take medicine.  You have vision changes. Summary  The third trimester is from week 28 through week 40, months 7 through 9. The third trimester is a time when the unborn baby (fetus) is growing rapidly.  During the third trimester, your discomfort may increase as you and your baby continue to gain weight. You may have abdominal, leg, and back pain, sleeping problems, and an increased need to urinate.  During the third trimester your breasts will keep growing and they will continue to become tender. A yellow fluid (colostrum) may leak from your breasts. This is the first milk you are producing for your baby.  False labor is a condition in which you feel small, irregular tightenings of the muscles in the womb (contractions) that eventually go away. These are called Braxton Hicks contractions. Contractions may last for hours, days, or even weeks before true labor sets in.  Signs of labor can include: abdominal cramps; regular contractions that start at 10 minutes apart and become stronger and more frequent with time; watery or bloody mucus discharge that comes from the vagina; increased pelvic pressure and dull back pain; and leaking of amniotic fluid. This information is not intended to replace advice given to you by your health care provider. Make sure you discuss any questions you have with your health care provider. Document Released: 01/17/2001 Document Revised: 07/01/2015 Document Reviewed: 03/26/2012 Elsevier Interactive Patient Education  2017 [employer].  Contraception Choices Contraception, also called birth control, refers to methods or devices that prevent pregnancy. Hormonal methods Contraceptive implant A contraceptive implant is a thin, plastic tube that  contains a hormone. It is inserted into the upper part of the arm. It can remain in place for up to 3 years. Progestin-only injections Progestin-only injections are injections of progestin, a synthetic form of the hormone progesterone. They are given every 3 months by a health care provider. Birth control pills Birth control pills are pills that contain hormones that prevent pregnancy. They must be taken once a day, preferably at the same time each day. Birth control patch The birth control patch contains hormones that prevent pregnancy. It is placed on the skin and must be changed once a week for three weeks and removed on the fourth week. A prescription is needed to use this method of contraception. Vaginal ring A vaginal ring contains hormones that prevent pregnancy. It is placed in the vagina for three weeks and removed on the fourth week. After that, the process is repeated with a new ring. A prescription is needed to use this method of contraception. Emergency contraceptive Emergency contraceptives prevent pregnancy after unprotected sex. They come in pill form and can be taken up to 5 days after sex. They work best the sooner they are taken after having sex. Most emergency contraceptives are available without a prescription. This method  should not be used as your only form of birth control. Barrier methods Female condom A female condom is a thin sheath that is worn over the penis during sex. Condoms keep sperm from going inside a woman's body. They can be used with a spermicide to increase their effectiveness. They should be disposed after a single use. Female condom A female condom is a soft, loose-fitting sheath that is put into the vagina before sex. The condom keeps sperm from going inside a woman's body. They should be disposed after a single use. Diaphragm A diaphragm is a soft, dome-shaped barrier. It is inserted into the vagina before sex, along with a spermicide. The diaphragm blocks  sperm from entering the uterus, and the spermicide kills sperm. A diaphragm should be left in the vagina for 6-8 hours after sex and removed within 24 hours. A diaphragm is prescribed and fitted by a health care provider. A diaphragm should be replaced every 1-2 years, after giving birth, after gaining more than 15 lb (6.8 kg), and after pelvic surgery. Cervical cap A cervical cap is a round, soft latex or plastic cup that fits over the cervix. It is inserted into the vagina before sex, along with spermicide. It blocks sperm from entering the uterus. The cap should be left in place for 6-8 hours after sex and removed within 48 hours. A cervical cap must be prescribed and fitted by a health care provider. It should be replaced every 2 years. Sponge A sponge is a soft, circular piece of polyurethane foam with spermicide on it. The sponge helps block sperm from entering the uterus, and the spermicide kills sperm. To use it, you make it wet and then insert it into the vagina. It should be inserted before sex, left in for at least 6 hours after sex, and removed and thrown away within 30 hours. Spermicides Spermicides are chemicals that kill or block sperm from entering the cervix and uterus. They can come as a cream, jelly, suppository, foam, or tablet. A spermicide should be inserted into the vagina with an applicator at least 10-15 minutes before sex to allow time for it to work. The process must be repeated every time you have sex. Spermicides do not require a prescription. Intrauterine contraception Intrauterine device (IUD) An IUD is a T-shaped device that is put in a woman's uterus. There are two types:  Hormone IUD.This type contains progestin, a synthetic form of the hormone progesterone. This type can stay in place for 3-5 years.  Copper IUD.This type is wrapped in copper wire. It can stay in place for 10 years.  Permanent methods of contraception Female tubal ligation In this method, a woman's  fallopian tubes are sealed, tied, or blocked during surgery to prevent eggs from traveling to the uterus. Hysteroscopic sterilization In this method, a small, flexible insert is placed into each fallopian tube. The inserts cause scar tissue to form in the fallopian tubes and block them, so sperm cannot reach an egg. The procedure takes about 3 months to be effective. Another form of birth control must be used during those 3 months. Female sterilization This is a procedure to tie off the tubes that carry sperm (vasectomy). After the procedure, the man can still ejaculate fluid (semen). Natural planning methods Natural family planning In this method, a couple does not have sex on days when the woman could become pregnant. Calendar method This means keeping track of the length of each menstrual cycle, identifying the days when pregnancy can  happen, and not having sex on those days. Ovulation method In this method, a couple avoids sex during ovulation. Symptothermal method This method involves not having sex during ovulation. The woman typically checks for ovulation by watching changes in her temperature and in the consistency of cervical mucus. Post-ovulation method In this method, a couple waits to have sex until after ovulation. Summary  Contraception, also called birth control, means methods or devices that prevent pregnancy.  Hormonal methods of contraception include implants, injections, pills, patches, vaginal rings, and emergency contraceptives.  Barrier methods of contraception can include female condoms, female condoms, diaphragms, cervical caps, sponges, and spermicides.  There are two types of IUDs (intrauterine devices). An IUD can be put in a woman's uterus to prevent pregnancy for 3-5 years.  Permanent sterilization can be done through a procedure for males, females, or both.  Natural family planning methods involve not having sex on days when the woman could become pregnant. This  information is not intended to replace advice given to you by your health care provider. Make sure you discuss any questions you have with your health care provider. Document Released: 01/23/2005 Document Revised: 02/26/2016 Document Reviewed: 02/26/2016 Elsevier Interactive Patient Education  2018 [employer]

## 2017-04-11 ENCOUNTER — Encounter: Payer: Self-pay | Admitting: Certified Nurse Midwife

## 2017-04-11 ENCOUNTER — Other Ambulatory Visit: Payer: Self-pay | Admitting: Certified Nurse Midwife

## 2017-04-11 DIAGNOSIS — O99019 Anemia complicating pregnancy, unspecified trimester: Secondary | ICD-10-CM | POA: Insufficient documentation

## 2017-04-11 DIAGNOSIS — O99013 Anemia complicating pregnancy, third trimester: Secondary | ICD-10-CM

## 2017-04-11 DIAGNOSIS — Z3402 Encounter for supervision of normal first pregnancy, second trimester: Secondary | ICD-10-CM

## 2017-04-11 LAB — CBC
HEMOGLOBIN: 10.4 g/dL — AB (ref 11.1–15.9)
Hematocrit: 33.6 % — ABNORMAL LOW (ref 34.0–46.6)
MCH: 27.7 pg (ref 26.6–33.0)
MCHC: 31 g/dL — ABNORMAL LOW (ref 31.5–35.7)
MCV: 90 fL (ref 79–97)
Platelets: 253 10*3/uL (ref 150–379)
RBC: 3.75 x10E6/uL — AB (ref 3.77–5.28)
RDW: 14 % (ref 12.3–15.4)
WBC: 6.4 10*3/uL (ref 3.4–10.8)

## 2017-04-11 LAB — HIV ANTIBODY (ROUTINE TESTING W REFLEX): HIV SCREEN 4TH GENERATION: NONREACTIVE

## 2017-04-11 LAB — RPR: RPR Ser Ql: NONREACTIVE

## 2017-04-11 LAB — GLUCOSE TOLERANCE, 2 HOURS W/ 1HR
GLUCOSE, 1 HOUR: 73 mg/dL (ref 65–179)
Glucose, 2 hour: 58 mg/dL — ABNORMAL LOW (ref 65–152)
Glucose, Fasting: 70 mg/dL (ref 65–91)

## 2017-04-11 MED ORDER — CITRANATAL BLOOM 90-1 MG PO TABS: 1.0000 | ORAL_TABLET | Freq: Every day | ORAL | 12 refills | Status: AC

## 2017-04-13 ENCOUNTER — Other Ambulatory Visit: Payer: Self-pay | Admitting: Certified Nurse Midwife

## 2017-04-17 ENCOUNTER — Ambulatory Visit (HOSPITAL_COMMUNITY)
Admission: RE | Admit: 2017-04-17 | Discharge: 2017-04-17 | Disposition: A | Discharge status: Home / Self Care | Payer: Medicaid Other | Source: Ambulatory Visit | Attending: Certified Nurse Midwife | Admitting: Certified Nurse Midwife

## 2017-04-17 ENCOUNTER — Other Ambulatory Visit: Payer: Self-pay | Admitting: Certified Nurse Midwife

## 2017-04-17 DIAGNOSIS — Z362 Encounter for other antenatal screening follow-up: Secondary | ICD-10-CM | POA: Diagnosis not present

## 2017-04-17 DIAGNOSIS — O99213 Obesity complicating pregnancy, third trimester: Secondary | ICD-10-CM

## 2017-04-17 DIAGNOSIS — Z0489 Encounter for examination and observation for other specified reasons: Secondary | ICD-10-CM

## 2017-04-17 DIAGNOSIS — Z3A28 28 weeks gestation of pregnancy: Secondary | ICD-10-CM

## 2017-04-17 DIAGNOSIS — IMO0002 Reserved for concepts with insufficient information to code with codable children: Secondary | ICD-10-CM

## 2017-04-17 DIAGNOSIS — Z3402 Encounter for supervision of normal first pregnancy, second trimester: Secondary | ICD-10-CM

## 2017-04-20 ENCOUNTER — Other Ambulatory Visit: Payer: Self-pay | Admitting: Certified Nurse Midwife

## 2017-04-20 DIAGNOSIS — Z3402 Encounter for supervision of normal first pregnancy, second trimester: Secondary | ICD-10-CM

## 2017-04-24 ENCOUNTER — Ambulatory Visit (INDEPENDENT_AMBULATORY_CARE_PROVIDER_SITE_OTHER): Payer: Medicaid Other | Admitting: Certified Nurse Midwife

## 2017-04-24 ENCOUNTER — Encounter: Payer: Self-pay | Admitting: Certified Nurse Midwife

## 2017-04-24 VITALS — BP 122/61 | HR 79 | Wt 229.2 lb

## 2017-04-24 DIAGNOSIS — Z283 Underimmunization status: Secondary | ICD-10-CM | POA: Diagnosis not present

## 2017-04-24 DIAGNOSIS — Z3400 Encounter for supervision of normal first pregnancy, unspecified trimester: Secondary | ICD-10-CM

## 2017-04-24 DIAGNOSIS — Z2839 Other underimmunization status: Secondary | ICD-10-CM

## 2017-04-24 DIAGNOSIS — O99212 Obesity complicating pregnancy, second trimester: Secondary | ICD-10-CM

## 2017-04-24 DIAGNOSIS — O99210 Obesity complicating pregnancy, unspecified trimester: Secondary | ICD-10-CM

## 2017-04-24 DIAGNOSIS — Z3402 Encounter for supervision of normal first pregnancy, second trimester: Secondary | ICD-10-CM | POA: Diagnosis not present

## 2017-04-24 DIAGNOSIS — O09899 Supervision of other high risk pregnancies, unspecified trimester: Secondary | ICD-10-CM

## 2017-04-24 DIAGNOSIS — O09892 Supervision of other high risk pregnancies, second trimester: Secondary | ICD-10-CM | POA: Diagnosis not present

## 2017-04-24 DIAGNOSIS — R8271 Bacteriuria: Secondary | ICD-10-CM | POA: Diagnosis not present

## 2017-04-24 NOTE — Progress Notes (Signed)
   PRENATAL VISIT NOTE  Subjective:  Brittany Berg is a 22 y.o. G1P0 at 4648w4d being seen today for ongoing prenatal care.  She is currently monitored for the following issues for this low-risk pregnancy and has Supervision of normal first pregnancy, antepartum; Obesity in pregnancy, antepartum; Maternal varicella, non-immune; GBS bacteriuria; and Anemia in pregnancy on their problem list.  Patient reports no complaints.  Contractions: Not present. Vag. Bleeding: None.  Movement: Present. Denies leaking of fluid.   The following portions of the patient's history were reviewed and updated as appropriate: allergies, current medications, past family history, past medical history, past social history, past surgical history and problem list. Problem list updated.  Objective:   Vitals:   04/24/17 1049  BP: 122/61  Pulse: 79  Weight: 229 lb 3.2 oz (104 kg)    Fetal Status: Fetal Heart Rate (bpm): 135; doppler Fundal Height: 30 cm Movement: Present     General:  Alert, oriented and cooperative. Patient is in no acute distress.  Skin: Skin is warm and dry. No rash noted.   Cardiovascular: Normal heart rate noted  Respiratory: Normal respiratory effort, no problems with respiration noted  Abdomen: Soft, gravid, appropriate for gestational age.  Pain/Pressure: Absent     Pelvic: Cervical exam deferred        Extremities: Normal range of motion.  Edema: Trace  Mental Status:  Normal mood and affect. Normal behavior. Normal judgment and thought content.   Assessment and Plan:  Pregnancy: G1P0 at 5848w4d  1. Supervision of normal first pregnancy, antepartum      Has gained 52 lbs this pregnancy so far.  Patient aware.  Normal glucola testing.  [place] completed.   2. GBS bacteriuria     PCN for labor/delivery  3. Obesity in pregnancy, antepartum     52 lb weight gain this pregnancy,   4. Maternal varicella, non-immune     Varicella  postpartum  Preterm labor symptoms and general obstetric precautions including but not limited to vaginal bleeding, contractions, leaking of fluid and fetal movement were reviewed in detail with the patient. Please refer to After Visit Summary for other counseling recommendations.  Return in about 2 weeks (around 05/08/2017) for ROB.   Roe Coombsachelle A Denney, CNM

## 2017-04-24 NOTE — Patient Instructions (Addendum)
AREA PEDIATRIC/FAMILY PRACTICE PHYSICIANS  [facility] [address], [address] [place], [place]  [address] Phone - [phone]   Fax - [phone]  [facility] [address] [place], [place] [address] Phone - [phone]   Fax - [phone]  JACK AMOS [address] [place], [place]  [address] Phone - [phone]   Fax - [phone]  [facility] [address] [place], [place]  [address] Phone - [phone]   Fax - [phone]  [facility] [address] [place], [place]  [address] Phone - [phone]   Fax - [phone]  [facility] [address] [place], [place]  [address] Phone - [phone]   Fax - [phone]  [facility] [address] [place], [place]  [address] Phone - [phone]   Fax - [phone]  [facility] [address], [address] [place], [place]  [address] Phone - [phone]   Fax - [phone]  [facility] [address] [place], [place]  [address] Phone - [phone]   Fax - [phone] [facility] [address] [place], [place]  [address] Phone - [phone]   Fax - [phone]  [facility] [address] [place], [place]  [address] Phone - [phone]   Fax - [phone]  [facility] [address] [place], [place]  [address] Phone - [phone]   Fax - [phone]  [facility] [address], [address] [place], [place]  [address] Phone - [phone]   Fax - [phone]  SHILPA GOSRANI [address] [place], [place]  [address] Phone - [phone]  [facility] [address] [place], [place]  [address] Phone - [phone]   Fax - [phone]  [facility] [address] [place], [place]  [address] Phone - [phone]   Fax - [phone]  [facility] [address] [place], [place]  [address] Phone - [phone]   Fax - [phone]  [facility] [address] [place], [place]  [address] Phone - [phone]   Fax - [phone]   [facility] [address] [place], [place]  [address] Phone - [phone]   Fax - [phone]  [facility] [address] [place], [place]  [address] Phone - [phone]   Fax - [phone]  DAVID RUBIN [address] [place], [place]  [address] Phone - [phone]   Fax - [phone]  [facility] [address] [place], [place]  [address] Phone - [phone]   Fax - [phone]  [facility] [address] [place], [place]  [address] Phone - [phone]   Fax - [phone] [facility] [address] [place], [place]  [address] Phone - [phone]   Fax - [phone]  [facility] [address] [place], [place]  [address] Phone - [phone]   Fax - [phone]  [facility] [address] [place],   [address] Phone - [phone]   Fax - [phone]  [facility] Charlene Flemming MD [address] [place]  [address] Phone [phone]  Fax [phone]   Before Baby Comes Home Before your baby arrives it is important to:  Have   all of the supplies that you will need to care for your baby.  Know where to go if there is an emergency.  Discuss the baby's arrival with other family members.  What supplies will I need?  It is recommended that you have the following supplies: Large Items  Crib.  Crib mattress.  Rear-facing infant car seat. If possible, have a trained professional check to make sure that it is installed  correctly.  Feeding  6-8 bottles that are 4-5 oz in size.  6-8 nipples.  Bottle brush.  Sterilizer, or a large pan or kettle with a lid.  A way to boil and cool water.  If you will be breastfeeding: ? Breast pump. ? Nipple cream. ? Nursing bra. ? Breast pads. ? Breast shields.  If you will be formula feeding: ? Formula. ? Measuring cups. ? Measuring spoons.  Bathing  Mild baby soap and baby shampoo.  Petroleum jelly.  Soft cloth towel and washcloth.  Hooded towel.  Cotton balls.  Bath basin.  Other Supplies  Rectal thermometer.  Bulb syringe.  Baby wipes or washcloths for diaper changes.  Diaper bag.  Changing pad.  Clothing, including one-piece outfits and pajamas.  Baby nail clippers.  Receiving blankets.  Mattress pad and sheets for the crib.  Night-light for the baby's room.  Baby monitor.  2 or 3 pacifiers.  Either 24-36 cloth diapers and waterproof diaper covers or a box of disposable diapers. You may need to use as many as 10-12 diapers per day.  How do I prepare for an emergency? Prepare for an emergency by:  Knowing how to get to the nearest hospital.  Listing the phone numbers of your baby's health care providers near your home phone and in your cell phone.  How do I prepare my family?  Decide how to handle visitors.  If you have other children: ? Talk with them about the baby coming home. Ask them how they feel about it. ? Read a book together about being a new big brother or sister. ? Find ways to let them help you prepare for the new baby. ? Have someone ready to care for them while you are in the hospital. This information is not intended to replace advice given to you by your health care provider. Make sure you discuss any questions you have with your health care provider. Document Released: 01/06/2008 Document Revised: 07/01/2015 Document Reviewed: 12/31/2013 Elsevier Interactive Patient Education  2018 Elsevier  Inc.  Third Trimester of Pregnancy The third trimester is from week 29 through week 42, months 7 through 9. This trimester is when your unborn baby (fetus) is growing very fast. At the end of the ninth month, the unborn baby is about 20 inches in length. It weighs about 6-10 pounds. Follow these instructions at home:  Avoid all smoking, herbs, and alcohol. Avoid drugs not approved by your doctor.  Do not use any tobacco products, including cigarettes, chewing tobacco, and electronic cigarettes. If you need help quitting, ask your doctor. You may get counseling or other support to help you quit.  Only take medicine as told by your doctor. Some medicines are safe and some are not during pregnancy.  Exercise only as told by your doctor. Stop exercising if you start having cramps.  Eat regular, healthy meals.  Wear a good support bra if your breasts are tender.  Do not use hot tubs, steam rooms, or saunas.  Wear your seat belt when driving.  Avoid raw meat,   uncooked cheese, and liter boxes and soil used by cats.  Take your prenatal vitamins.  Take 1500-2000 milligrams of calcium daily starting at the 20th week of pregnancy until you deliver your baby.  Try taking medicine that helps you poop (stool softener) as needed, and if your doctor approves. Eat more fiber by eating fresh fruit, vegetables, and whole grains. Drink enough fluids to keep your pee (urine) clear or pale yellow.  Take warm water baths (sitz baths) to soothe pain or discomfort caused by hemorrhoids. Use hemorrhoid cream if your doctor approves.  If you have puffy, bulging veins (varicose veins), wear support hose. Raise (elevate) your feet for 15 minutes, 3-4 times a day. Limit salt in your diet.  Avoid heavy lifting, wear low heels, and sit up straight.  Rest with your legs raised if you have leg cramps or low back pain.  Visit your dentist if you have not gone during your pregnancy. Use a soft toothbrush to brush  your teeth. Be gentle when you floss.  You can have sex (intercourse) unless your doctor tells you not to.  Do not travel far distances unless you must. Only do so with your doctor's approval.  Take prenatal classes.  Practice driving to the hospital.  Pack your hospital bag.  Prepare the baby's room.  Go to your doctor visits. Get help if:  You are not sure if you are in labor or if your water has broken.  You are dizzy.  You have mild cramps or pressure in your lower belly (abdominal).  You have a nagging pain in your belly area.  You continue to feel sick to your stomach (nauseous), throw up (vomit), or have watery poop (diarrhea).  You have bad smelling fluid coming from your vagina.  You have pain with peeing (urination). Get help right away if:  You have a fever.  You are leaking fluid from your vagina.  You are spotting or bleeding from your vagina.  You have severe belly cramping or pain.  You lose or gain weight rapidly.  You have trouble catching your breath and have chest pain.  You notice sudden or extreme puffiness (swelling) of your face, hands, ankles, feet, or legs.  You have not felt the baby move in over an hour.  You have severe headaches that do not go away with medicine.  You have vision changes. This information is not intended to replace advice given to you by your health care provider. Make sure you discuss any questions you have with your health care provider. Document Released: 04/19/2009 Document Revised: 07/01/2015 Document Reviewed: 03/26/2012 Elsevier Interactive Patient Education  2017 Elsevier Inc.  [employer] of the uterus can occur throughout pregnancy, but they are not always a sign that you are in labor. You may have practice contractions called Braxton Hicks contractions. These false labor contractions are sometimes confused with true labor. What are Deberah Pelton contractions? Braxton Hicks  contractions are tightening movements that occur in the muscles of the uterus before labor. Unlike true labor contractions, these contractions do not result in opening (dilation) and thinning of the cervix. Toward the end of pregnancy (32-34 weeks), Braxton Hicks contractions can happen more often and may become stronger. These contractions are sometimes difficult to tell apart from true labor because they can be very uncomfortable. You should not feel embarrassed if you go to the hospital with false labor. Sometimes, the only way to tell if you are in true labor is for  your health care provider to look for changes in the cervix. The health care provider will do a physical exam and may monitor your contractions. If you are not in true labor, the exam should show that your cervix is not dilating and your water has not broken. If there are other health problems associated with your pregnancy, it is completely safe for you to be sent home with false labor. You may continue to have Braxton Hicks contractions until you go into true labor. How to tell the difference between true labor and false labor True labor  Contractions last 30-70 seconds.  Contractions become very regular.  Discomfort is usually felt in the top of the uterus, and it spreads to the lower abdomen and low back.  Contractions do not go away with walking.  Contractions usually become more intense and increase in frequency.  The cervix dilates and gets thinner. False labor  Contractions are usually shorter and not as strong as true labor contractions.  Contractions are usually irregular.  Contractions are often felt in the front of the lower abdomen and in the groin.  Contractions may go away when you walk around or change positions while lying down.  Contractions get weaker and are shorter-lasting as time goes on.  The cervix usually does not dilate or become thin. Follow these instructions at home:  Take over-the-counter  and prescription medicines only as told by your health care provider.  Keep up with your usual exercises and follow other instructions from your health care provider.  Eat and drink lightly if you think you are going into labor.  If Braxton Hicks contractions are making you uncomfortable: ? Change your position from lying down or resting to walking, or change from walking to resting. ? Sit and rest in a tub of warm water. ? Drink enough fluid to keep your urine pale yellow. Dehydration may cause these contractions. ? Do slow and deep breathing several times an hour.  Keep all follow-up prenatal visits as told by your health care provider. This is important. Contact a health care provider if:  You have a fever.  You have continuous pain in your abdomen. Get help right away if:  Your contractions become stronger, more regular, and closer together.  You have fluid leaking or gushing from your vagina.  You pass blood-tinged mucus (bloody show).  You have bleeding from your vagina.  You have low back pain that you never had before.  You feel your baby's head pushing down and causing pelvic pressure.  Your baby is not moving inside you as much as it used to. Summary  Contractions that occur before labor are called Braxton Hicks contractions, false labor, or practice contractions.  Braxton Hicks contractions are usually shorter, weaker, farther apart, and less regular than true labor contractions. True labor contractions usually become progressively stronger and regular and they become more frequent.  Manage discomfort from [facility] contractions by changing position, resting in a warm bath, drinking plenty of water, or practicing deep breathing. This information is not intended to replace advice given to you by your health care provider. Make sure you discuss any questions you have with your health care provider. Document Released: 06/08/2016 Document Revised: 06/08/2016  Document Reviewed: 06/08/2016 Elsevier Interactive Patient Education  2018 [employer].  Contraception Choices Contraception, also called birth control, refers to methods or devices that prevent pregnancy. Hormonal methods Contraceptive implant A contraceptive implant is a thin, plastic tube that contains a hormone. It is inserted  into the upper part of the arm. It can remain in place for up to 3 years. Progestin-only injections Progestin-only injections are injections of progestin, a synthetic form of the hormone progesterone. They are given every 3 months by a health care provider. Birth control pills Birth control pills are pills that contain hormones that prevent pregnancy. They must be taken once a day, preferably at the same time each day. Birth control patch The birth control patch contains hormones that prevent pregnancy. It is placed on the skin and must be changed once a week for three weeks and removed on the fourth week. A prescription is needed to use this method of contraception. Vaginal ring A vaginal ring contains hormones that prevent pregnancy. It is placed in the vagina for three weeks and removed on the fourth week. After that, the process is repeated with a new ring. A prescription is needed to use this method of contraception. Emergency contraceptive Emergency contraceptives prevent pregnancy after unprotected sex. They come in pill form and can be taken up to 5 days after sex. They work best the sooner they are taken after having sex. Most emergency contraceptives are available without a prescription. This method should not be used as your only form of birth control. Barrier methods Female condom A female condom is a thin sheath that is worn over the penis during sex. Condoms keep sperm from going inside a woman's body. They can be used with a spermicide to increase their effectiveness. They should be disposed after a single use. Female condom A female condom is a soft,  loose-fitting sheath that is put into the vagina before sex. The condom keeps sperm from going inside a woman's body. They should be disposed after a single use. Diaphragm A diaphragm is a soft, dome-shaped barrier. It is inserted into the vagina before sex, along with a spermicide. The diaphragm blocks sperm from entering the uterus, and the spermicide kills sperm. A diaphragm should be left in the vagina for 6-8 hours after sex and removed within 24 hours. A diaphragm is prescribed and fitted by a health care provider. A diaphragm should be replaced every 1-2 years, after giving birth, after gaining more than 15 lb (6.8 kg), and after pelvic surgery. Cervical cap A cervical cap is a round, soft latex or plastic cup that fits over the cervix. It is inserted into the vagina before sex, along with spermicide. It blocks sperm from entering the uterus. The cap should be left in place for 6-8 hours after sex and removed within 48 hours. A cervical cap must be prescribed and fitted by a health care provider. It should be replaced every 2 years. Sponge A sponge is a soft, circular piece of polyurethane foam with spermicide on it. The sponge helps block sperm from entering the uterus, and the spermicide kills sperm. To use it, you make it wet and then insert it into the vagina. It should be inserted before sex, left in for at least 6 hours after sex, and removed and thrown away within 30 hours. Spermicides Spermicides are chemicals that kill or block sperm from entering the cervix and uterus. They can come as a cream, jelly, suppository, foam, or tablet. A spermicide should be inserted into the vagina with an applicator at least 10-15 minutes before sex to allow time for it to work. The process must be repeated every time you have sex. Spermicides do not require a prescription. Intrauterine contraception Intrauterine device (IUD) An IUD is a T-shaped  device that is put in a woman's uterus. There are two  types:  Hormone IUD.This type contains progestin, a synthetic form of the hormone progesterone. This type can stay in place for 3-5 years.  Copper IUD.This type is wrapped in copper wire. It can stay in place for 10 years.  Permanent methods of contraception Female tubal ligation In this method, a woman's fallopian tubes are sealed, tied, or blocked during surgery to prevent eggs from traveling to the uterus. Hysteroscopic sterilization In this method, a small, flexible insert is placed into each fallopian tube. The inserts cause scar tissue to form in the fallopian tubes and block them, so sperm cannot reach an egg. The procedure takes about 3 months to be effective. Another form of birth control must be used during those 3 months. Female sterilization This is a procedure to tie off the tubes that carry sperm (vasectomy). After the procedure, the man can still ejaculate fluid (semen). Natural planning methods Natural family planning In this method, a couple does not have sex on days when the woman could become pregnant. Calendar method This means keeping track of the length of each menstrual cycle, identifying the days when pregnancy can happen, and not having sex on those days. Ovulation method In this method, a couple avoids sex during ovulation. Symptothermal method This method involves not having sex during ovulation. The woman typically checks for ovulation by watching changes in her temperature and in the consistency of cervical mucus. Post-ovulation method In this method, a couple waits to have sex until after ovulation. Summary  Contraception, also called birth control, means methods or devices that prevent pregnancy.  Hormonal methods of contraception include implants, injections, pills, patches, vaginal rings, and emergency contraceptives.  Barrier methods of contraception can include female condoms, female condoms, diaphragms, cervical caps, sponges, and spermicides.  There  are two types of IUDs (intrauterine devices). An IUD can be put in a woman's uterus to prevent pregnancy for 3-5 years.  Permanent sterilization can be done through a procedure for males, females, or both.  Natural family planning methods involve not having sex on days when the woman could become pregnant. This information is not intended to replace advice given to you by your health care provider. Make sure you discuss any questions you have with your health care provider. Document Released: 01/23/2005 Document Revised: 02/26/2016 Document Reviewed: 02/26/2016 Elsevier Interactive Patient Education  2018 [employer]

## 2017-09-28 ENCOUNTER — Encounter (HOSPITAL_COMMUNITY): Payer: Self-pay

## 2017-11-27 IMAGING — US US OB COMP LESS 14 WK
1 series · 15 of 28 positions shown · non-contrast
Comparison: None.

CLINICAL DATA: Initial evaluation for vaginal bleeding for 1 week,
cramping.

EXAM:
OBSTETRIC <14 WK US AND TRANSVAGINAL OB US
TECHNIQUE: Both transabdominal and transvaginal ultrasound examinations were
performed for complete evaluation of the gestation as well as the
maternal uterus, adnexal regions, and pelvic cul-de-sac.
Transvaginal technique was performed to assess early pregnancy.

[Series 1: us ob comp less 14 wk · 15 of 57 slices shown]
[im 1/57]
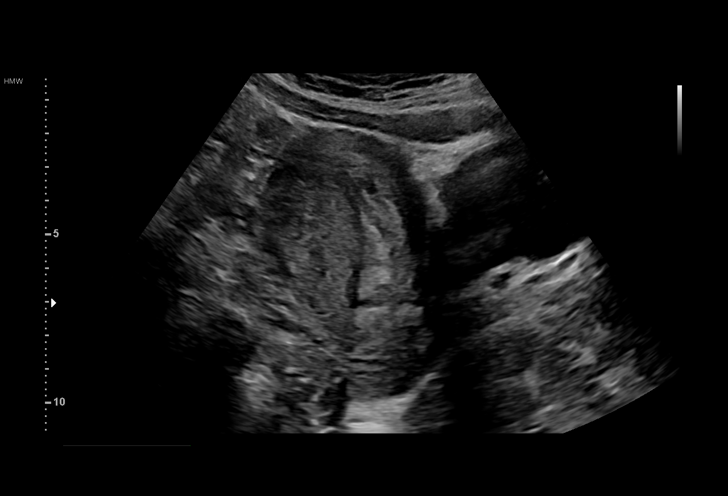
[im 5/57]
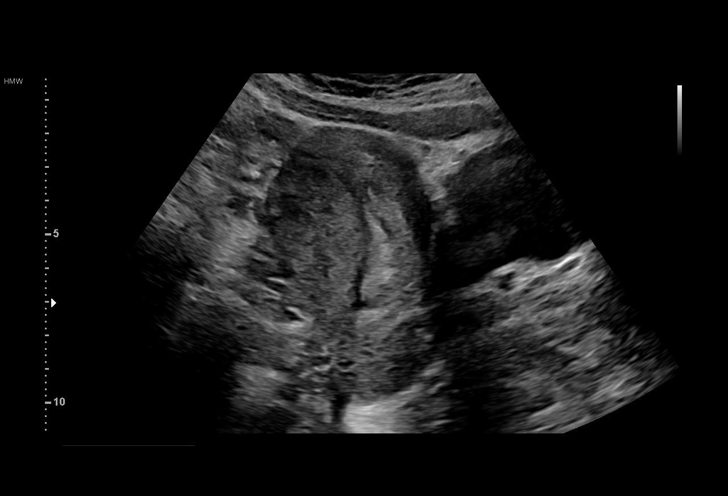
[im 9/57]
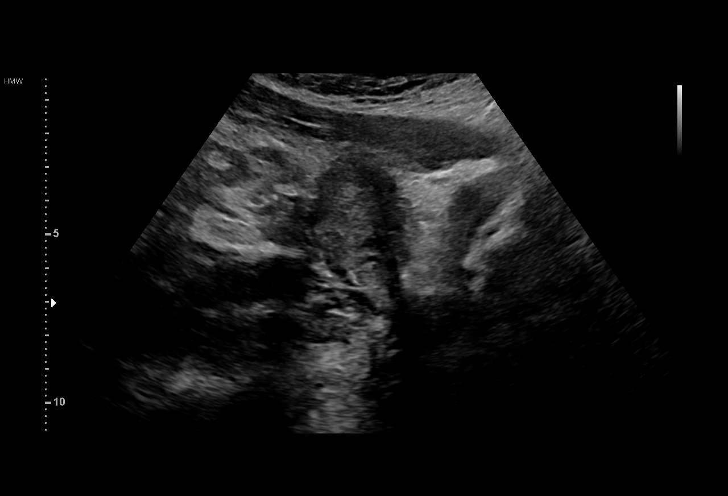
[im 13/57]
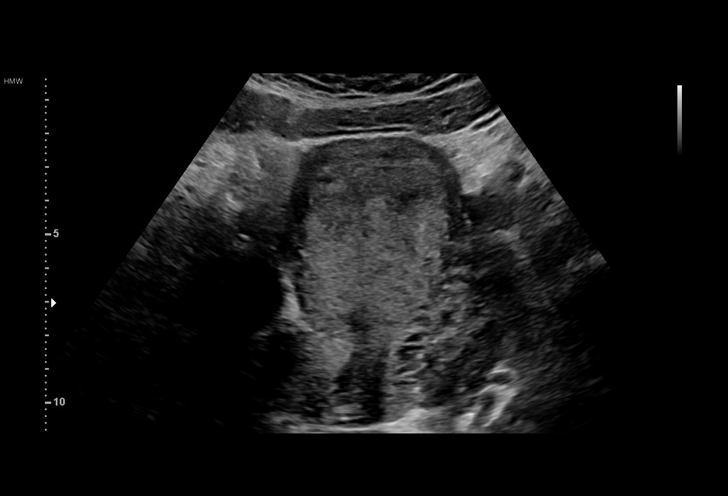
[im 17/57]
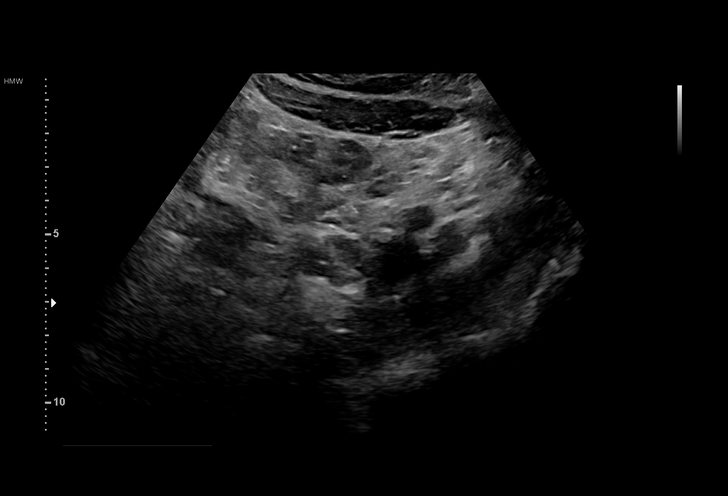
[im 21/57]
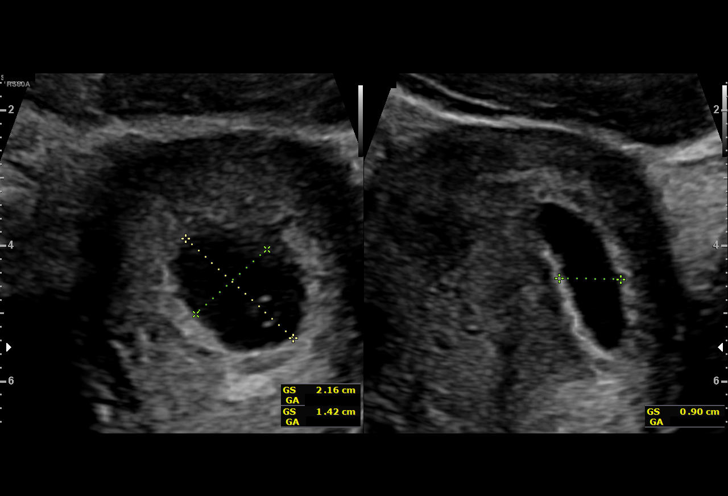
[im 25/57]
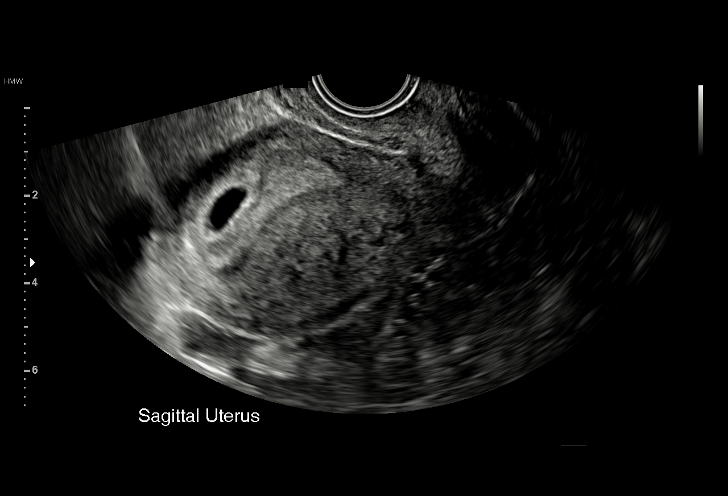
[im 30/57]
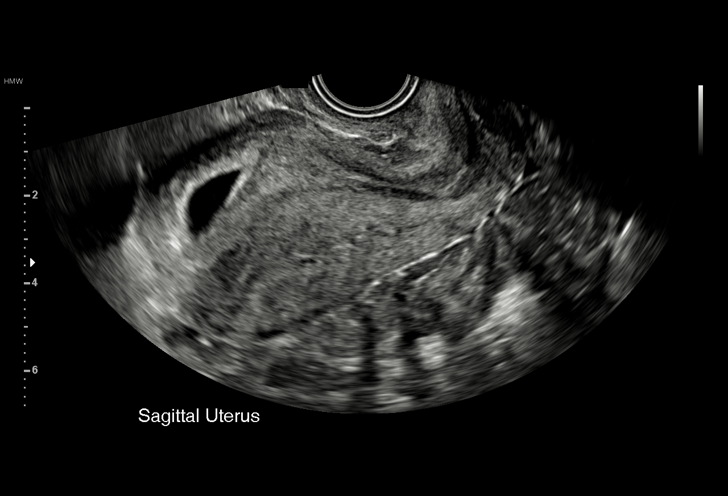
[im 32/57]
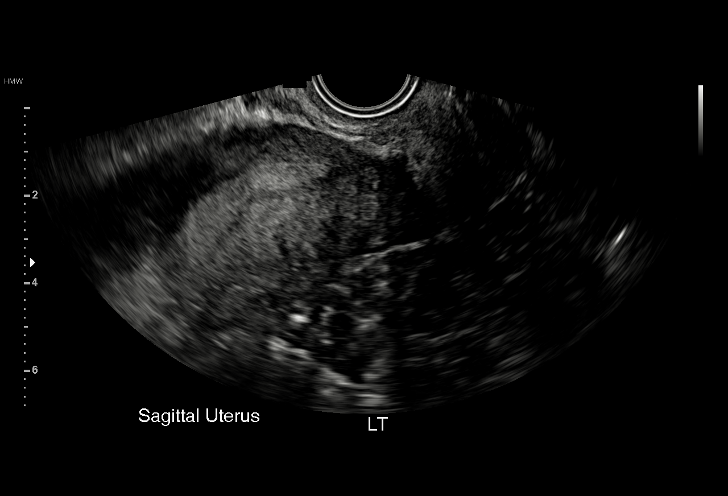
[im 36/57]
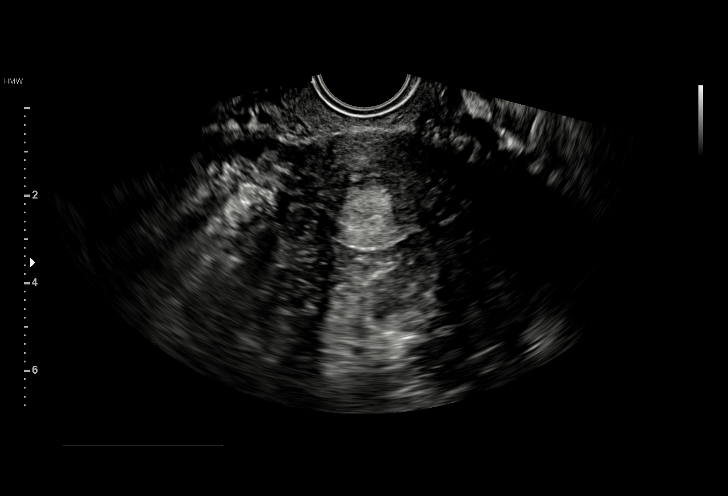
[im 40/57]
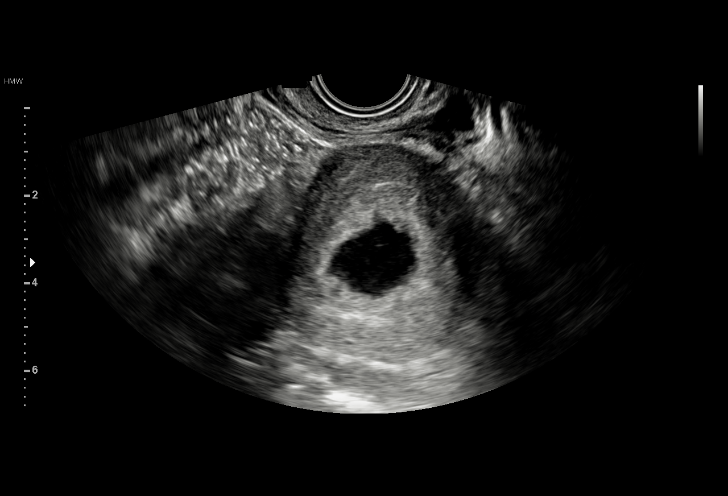
[im 44/57]
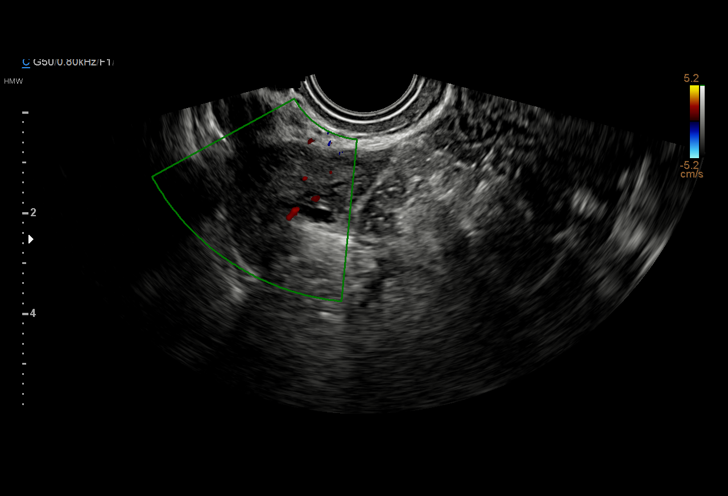
[im 48/57]
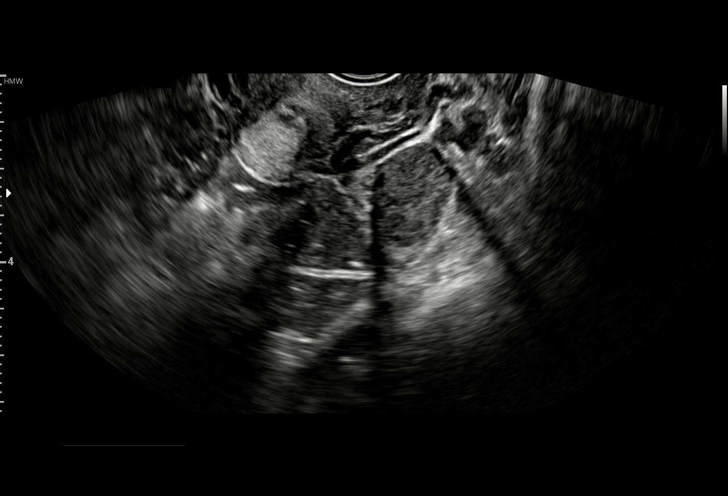
[im 52/57]
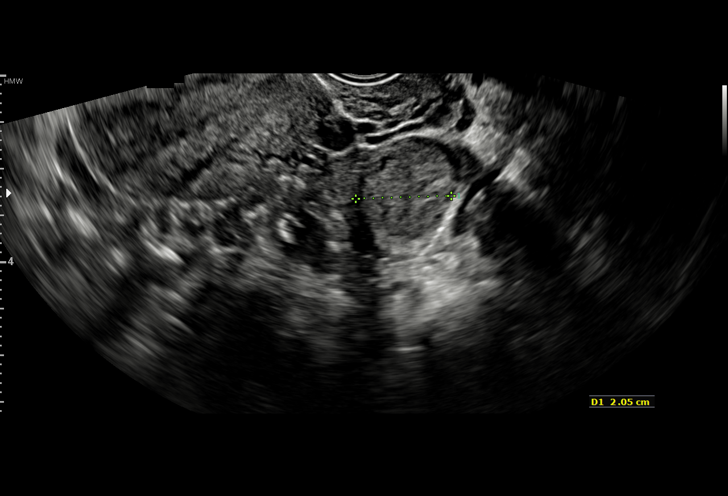
[im 57/57]
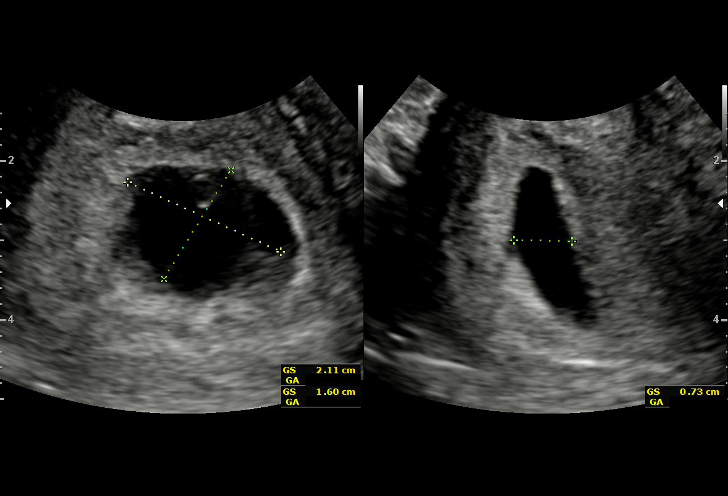

[15 of 28 positions shown; findings below may reference images not displayed]

FINDINGS: Intrauterine gestational sac: Single

Yolk sac:  Present

Embryo:  Not visualized.

Cardiac Activity: N/A

Heart Rate: N/A  bpm

MSD: 14.8  mm   6 w   2  d

Subchorionic hemorrhage:  None visualized.

Maternal uterus/adnexae: Ovaries visualized bilaterally and are
normal in appearance. No adnexal mass. No free fluid within the
pelvis.
IMPRESSION: 1. Probable early intrauterine gestational sac with internal yolk
sac, but no fetal pole or cardiac activity yet visualized. Recommend
follow-up quantitative B-HCG levels and follow-up US in 14 days to
assess viability.
2. No other acute maternal uterine or adnexal abnormality
identified.
